# Patient Record
Sex: Male | Born: 1955 | Race: White | Hispanic: No | Marital: Married | Smoking: Never smoker
Health system: Southern US, Community
[De-identification: ages and names within clinical notes are randomized; demographics above are authoritative.]

## PROBLEM LIST (undated history)

## (undated) DIAGNOSIS — T7840XA Allergy, unspecified, initial encounter: Secondary | ICD-10-CM

## (undated) DIAGNOSIS — C801 Malignant (primary) neoplasm, unspecified: Secondary | ICD-10-CM

## (undated) DIAGNOSIS — M1990 Unspecified osteoarthritis, unspecified site: Secondary | ICD-10-CM

## (undated) DIAGNOSIS — I10 Essential (primary) hypertension: Secondary | ICD-10-CM

## (undated) DIAGNOSIS — G4730 Sleep apnea, unspecified: Secondary | ICD-10-CM

## (undated) DIAGNOSIS — E785 Hyperlipidemia, unspecified: Secondary | ICD-10-CM

## (undated) DIAGNOSIS — K219 Gastro-esophageal reflux disease without esophagitis: Secondary | ICD-10-CM

## (undated) HISTORY — PX: KNEE ARTHROSCOPY: SUR90

## (undated) HISTORY — DX: Hyperlipidemia, unspecified: E78.5

## (undated) HISTORY — DX: Malignant (primary) neoplasm, unspecified: C80.1

## (undated) HISTORY — PX: COLONOSCOPY: SHX174

## (undated) HISTORY — DX: Essential (primary) hypertension: I10

## (undated) HISTORY — DX: Gastro-esophageal reflux disease without esophagitis: K21.9

## (undated) HISTORY — DX: Sleep apnea, unspecified: G47.30

## (undated) HISTORY — DX: Allergy, unspecified, initial encounter: T78.40XA

## (undated) HISTORY — DX: Unspecified osteoarthritis, unspecified site: M19.90

---
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
## 1998-10-02 ENCOUNTER — Emergency Department (HOSPITAL_COMMUNITY): Admission: EM | Admit: 1998-10-02 | Discharge: 1998-10-02 | Payer: Self-pay | Admitting: Emergency Medicine

## 2004-01-03 ENCOUNTER — Ambulatory Visit: Payer: Self-pay | Admitting: Internal Medicine

## 2004-02-15 ENCOUNTER — Ambulatory Visit: Payer: Self-pay | Admitting: Internal Medicine

## 2004-02-18 ENCOUNTER — Ambulatory Visit: Payer: Self-pay | Admitting: Pulmonary Disease

## 2004-02-18 ENCOUNTER — Ambulatory Visit (HOSPITAL_BASED_OUTPATIENT_CLINIC_OR_DEPARTMENT_OTHER): Admission: RE | Admit: 2004-02-18 | Discharge: 2004-02-18 | Payer: Self-pay | Admitting: Internal Medicine

## 2005-09-11 ENCOUNTER — Ambulatory Visit: Payer: Self-pay | Admitting: Internal Medicine

## 2005-09-19 ENCOUNTER — Ambulatory Visit: Payer: Self-pay | Admitting: Internal Medicine

## 2005-11-07 ENCOUNTER — Ambulatory Visit: Payer: Self-pay | Admitting: Internal Medicine

## 2006-06-22 ENCOUNTER — Ambulatory Visit: Payer: Self-pay | Admitting: Internal Medicine

## 2006-07-01 ENCOUNTER — Ambulatory Visit: Payer: Self-pay

## 2006-08-06 ENCOUNTER — Ambulatory Visit: Payer: Self-pay | Admitting: Internal Medicine

## 2006-08-06 LAB — CONVERTED CEMR LAB
ALT: 24 units/L (ref 0–53)
AST: 24 units/L (ref 0–37)
Albumin: 3.4 g/dL — ABNORMAL LOW (ref 3.5–5.2)
Alkaline Phosphatase: 43 units/L (ref 39–117)
Cholesterol: 189 mg/dL (ref 0–200)
Total Bilirubin: 0.6 mg/dL (ref 0.3–1.2)
Total CHOL/HDL Ratio: 4.5

## 2006-08-13 ENCOUNTER — Ambulatory Visit: Payer: Self-pay | Admitting: Internal Medicine

## 2006-11-26 ENCOUNTER — Telehealth (INDEPENDENT_AMBULATORY_CARE_PROVIDER_SITE_OTHER): Payer: Self-pay | Admitting: *Deleted

## 2006-12-16 ENCOUNTER — Telehealth: Payer: Self-pay | Admitting: Internal Medicine

## 2007-02-23 ENCOUNTER — Ambulatory Visit: Payer: Self-pay | Admitting: Internal Medicine

## 2007-02-23 DIAGNOSIS — K219 Gastro-esophageal reflux disease without esophagitis: Secondary | ICD-10-CM | POA: Insufficient documentation

## 2007-02-23 DIAGNOSIS — I10 Essential (primary) hypertension: Secondary | ICD-10-CM | POA: Insufficient documentation

## 2007-02-23 DIAGNOSIS — J309 Allergic rhinitis, unspecified: Secondary | ICD-10-CM | POA: Insufficient documentation

## 2007-02-23 DIAGNOSIS — R635 Abnormal weight gain: Secondary | ICD-10-CM | POA: Insufficient documentation

## 2007-02-23 DIAGNOSIS — E785 Hyperlipidemia, unspecified: Secondary | ICD-10-CM | POA: Insufficient documentation

## 2007-04-09 ENCOUNTER — Telehealth: Payer: Self-pay | Admitting: Internal Medicine

## 2007-04-28 ENCOUNTER — Telehealth: Payer: Self-pay | Admitting: Internal Medicine

## 2007-05-25 ENCOUNTER — Telehealth: Payer: Self-pay | Admitting: Internal Medicine

## 2007-06-30 ENCOUNTER — Encounter: Payer: Self-pay | Admitting: Internal Medicine

## 2007-11-10 ENCOUNTER — Telehealth: Payer: Self-pay | Admitting: Internal Medicine

## 2008-03-13 ENCOUNTER — Encounter: Payer: Self-pay | Admitting: Internal Medicine

## 2008-08-10 ENCOUNTER — Telehealth (INDEPENDENT_AMBULATORY_CARE_PROVIDER_SITE_OTHER): Payer: Self-pay | Admitting: *Deleted

## 2008-08-18 ENCOUNTER — Ambulatory Visit: Payer: Self-pay | Admitting: Internal Medicine

## 2008-08-18 DIAGNOSIS — S93409A Sprain of unspecified ligament of unspecified ankle, initial encounter: Secondary | ICD-10-CM | POA: Insufficient documentation

## 2008-08-18 LAB — CONVERTED CEMR LAB
ALT: 23 units/L (ref 0–53)
AST: 22 units/L (ref 0–37)
Alkaline Phosphatase: 47 units/L (ref 39–117)
BUN: 12 mg/dL (ref 6–23)
Bilirubin Urine: NEGATIVE
Bilirubin, Direct: 0.1 mg/dL (ref 0.0–0.3)
Cholesterol: 170 mg/dL (ref 0–200)
Creatinine, Ser: 1.1 mg/dL (ref 0.4–1.5)
Eosinophils Relative: 3.4 % (ref 0.0–5.0)
GFR calc non Af Amer: 74.97 mL/min (ref >60)
LDL Cholesterol: 98 mg/dL (ref 0–99)
Monocytes Relative: 6.7 % (ref 3.0–12.0)
Neutrophils Relative %: 61 % (ref 43.0–77.0)
Nitrite: NEGATIVE
Platelets: 240 10*3/uL (ref 150.0–400.0)
Potassium: 4.2 meq/L (ref 3.5–5.1)
Protein, U semiquant: NEGATIVE
Total Bilirubin: 0.8 mg/dL (ref 0.3–1.2)
Total CHOL/HDL Ratio: 4
Triglycerides: 168 mg/dL — ABNORMAL HIGH (ref 0.0–149.0)
Urobilinogen, UA: 0.2
VLDL: 33.6 mg/dL (ref 0.0–40.0)
WBC: 7 10*3/uL (ref 4.5–10.5)

## 2008-09-12 ENCOUNTER — Ambulatory Visit: Payer: Self-pay | Admitting: Internal Medicine

## 2008-09-26 ENCOUNTER — Encounter: Payer: Self-pay | Admitting: Internal Medicine

## 2008-09-26 ENCOUNTER — Ambulatory Visit: Payer: Self-pay | Admitting: Internal Medicine

## 2008-10-02 ENCOUNTER — Encounter: Payer: Self-pay | Admitting: Internal Medicine

## 2009-02-23 ENCOUNTER — Ambulatory Visit: Payer: Self-pay | Admitting: Internal Medicine

## 2009-02-23 LAB — CONVERTED CEMR LAB
ALT: 35 units/L (ref 0–53)
HDL: 40.7 mg/dL (ref >39.00)
Total Bilirubin: 0.5 mg/dL (ref 0.3–1.2)
Total CHOL/HDL Ratio: 4
VLDL: 22.8 mg/dL (ref 0.0–40.0)

## 2009-02-24 ENCOUNTER — Encounter: Payer: Self-pay | Admitting: Internal Medicine

## 2009-03-02 ENCOUNTER — Ambulatory Visit: Payer: Self-pay | Admitting: Internal Medicine

## 2009-03-02 DIAGNOSIS — E669 Obesity, unspecified: Secondary | ICD-10-CM | POA: Insufficient documentation

## 2009-03-05 ENCOUNTER — Telehealth: Payer: Self-pay | Admitting: Internal Medicine

## 2009-04-02 ENCOUNTER — Telehealth: Payer: Self-pay | Admitting: Internal Medicine

## 2009-04-04 ENCOUNTER — Encounter: Payer: Self-pay | Admitting: Internal Medicine

## 2009-06-08 ENCOUNTER — Ambulatory Visit: Payer: Self-pay | Admitting: Internal Medicine

## 2009-08-10 ENCOUNTER — Ambulatory Visit: Payer: Self-pay | Admitting: Family Medicine

## 2009-08-10 DIAGNOSIS — J209 Acute bronchitis, unspecified: Secondary | ICD-10-CM | POA: Insufficient documentation

## 2009-12-21 ENCOUNTER — Ambulatory Visit: Payer: Self-pay | Admitting: Internal Medicine

## 2009-12-21 LAB — CONVERTED CEMR LAB
Free T4: 0.65 ng/dL — ABNORMAL LOW (ref 0.80–1.80)
T3, Free: 3.1 pg/mL (ref 2.3–4.2)
TSH: 2.079 microintl units/mL (ref 0.350–4.500)

## 2010-03-13 NOTE — Progress Notes (Signed)
Summary: new rx  Phone Note Call from Patient Call back at Home Phone [phone]   Caller: Spouse Call For: Stacie Glaze MD Summary of Call: pt needs new rx for [facility] call into [facility] [phone] Initial call taken by: Heron Sabins,  April 02, 2009 10:13 AM    New/Updated Medications: OMNARIS 50 MCG/ACT SUSP (CICLESONIDE) Use as directed Prescriptions: OMNARIS 50 MCG/ACT SUSP (CICLESONIDE) Use as directed  #1 x 6   Entered by:   Willy Eddy, LPN   Authorized by:   Stacie Glaze MD   Signed by:   Willy Eddy, LPN on 47/82/9562   Method used:   Electronically to        [facility] #[id number]* (retail)       [address]       [place], [place]  [address]       Ph: [phone]       Fax: [phone]   RxID:   [id number]

## 2010-03-13 NOTE — Assessment & Plan Note (Signed)
Summary: 3 month rov/njr   Vital Signs:  Patient profile:   55 year old male Height:      70 inches Weight:      258 pounds BMI:     37.15 Temp:     98.2 degrees F oral Pulse rate:   72 / minute Resp:     14 per minute BP sitting:   126 / 80  (left arm)  Vitals Entered By: Willy Eddy, LPN (June 08, 2009 4:20 PM) CC: roa   CC:  roa.  Preventive Screening-Counseling & Management  Alcohol-Tobacco     Smoking Status: never  Current Problems (verified): 1)  Morbid Obesity  (ICD-278.01) 2)  Special Screening For Malignant Neoplasms Colon  (ICD-V76.51) 3)  Preventive Health Care  (ICD-V70.0) 4)  Ankle Sprain, Right  (ICD-845.00) 5)  Abnormal Weight Gain  (ICD-783.1) 6)  Allergic Rhinitis  (ICD-477.9) 7)  Gerd  (ICD-530.81) 8)  Hypertension  (ICD-401.9) 9)  Hyperlipidemia  (ICD-272.4)  Current Medications (verified): 1)  Exforge 5-160 Mg  Tabs (Amlodipine Besylate-Valsartan) .... Once Daily 2)  Nasonex 50 Mcg/act  Susp (Mometasone Furoate) .... Two Sprays Q Hs in Each Nostril 3)  Zyrtec Allergy 10 Mg  Tabs (Cetirizine Hcl) .... One By Mouth Q Hs 4)  Lipitor 20 Mg  Tabs (Atorvastatin Calcium) .... One Tab By Mouth Daily 5)  Nexium 40 Mg  Cpdr (Esomeprazole Magnesium) .Marland Kitchen.. 1 Once Daily-No More Without Ov 6)  Zaleplon 10 Mg Caps (Zaleplon) .... One By Mouth Q Hs Prn 7)  Aspir-Low 81 Mg Tbec (Aspirin) .Marland Kitchen.. 1 Once Daily 8)  Xenical 120 Mg Caps (Orlistat) .... One By Mouth Three Times A Day Q Ac 9)  Clarinex 5 Mg Tabs (Desloratadine) .Marland Kitchen.. 1 Once Daily 10)  Flonase 50 Mcg/act Susp (Fluticasone Propionate) .Marland Kitchen.. 1 Spray Each Nostril Two Times A Day  Allergies (verified): No Known Drug Allergies  Past History:  Family History: Last updated: 02/23/2007 Father:  Mother: lymphoma stage 3 Siblings:  Family History Hypertension  Social History: Last updated: 02/23/2007 Married Alcohol use-yes Regular exercise-no  Risk Factors: Exercise: no (02/23/2007)  Risk  Factors: Smoking Status: never (06/08/2009)  Past medical, surgical, family and social histories (including risk factors) reviewed, and no changes noted (except as noted below).  Past Medical History: Reviewed history from 02/23/2007 and no changes required. Hyperlipidemia Hypertension GERD Allergic rhinitis  Family History: Reviewed history from 02/23/2007 and no changes required. Father:  Mother: lymphoma stage 3 Siblings:  Family History Hypertension  Social History: Reviewed history from 02/23/2007 and no changes required. Married Alcohol use-yes Regular exercise-no  Review of Systems  The patient denies anorexia, fever, weight loss, weight gain, vision loss, decreased hearing, hoarseness, chest pain, syncope, dyspnea on exertion, peripheral edema, prolonged cough, headaches, hemoptysis, abdominal pain, melena, hematochezia, severe indigestion/heartburn, hematuria, incontinence, genital sores, muscle weakness, suspicious skin lesions, transient blindness, difficulty walking, depression, unusual weight change, abnormal bleeding, enlarged lymph nodes, angioedema, and breast masses.    Physical Exam  General:  Well-developed,well-nourished,in no acute distress; alert,appropriate and cooperative throughout examination Head:  Normocephalic and atraumatic without obvious abnormalities. No apparent alopecia or balding. Ears:  R ear normal and L ear normal.   Nose:  no external deformity and no nasal discharge.   Neck:  No deformities, masses, or tenderness noted. Lungs:  normal respiratory effort and no dullness.   Heart:  normal rate and regular rhythm.   Abdomen:  soft, non-tender, and normal bowel sounds.   Msk:  normal ROM, no joint tenderness, and no joint swelling.     Impression & Recommendations:  Problem # 1:  MORBID OBESITY (ICD-278.01)  has lost 17 lbs in 3 months  Ht: 70 (06/08/2009)   Wt: 258 (06/08/2009)   BMI: 37.15 (06/08/2009)  Problem # 2:   HYPERTENSION (ICD-401.9) good control with the weight loss His updated medication list for this problem includes:    Exforge 5-160 Mg Tabs (Amlodipine besylate-valsartan) ..... Once daily  BP today: 126/80 Prior BP: 110/78 (03/02/2009)  Prior 10 Yr Risk Heart Disease: 11 % (08/18/2008)  Labs Reviewed: K+: 4.2 (08/18/2008) Creat: : 1.1 (08/18/2008)   Chol: 166 (02/23/2009)   HDL: 40.70 (02/23/2009)   LDL: 103 (02/23/2009)   TG: 114.0 (02/23/2009)  Problem # 3:  GERD (ICD-530.81)  His updated medication list for this problem includes:    Nexium 40 Mg Cpdr (Esomeprazole magnesium) .Marland Kitchen... 1 once daily-no more without ov  Labs Reviewed: Hgb: 14.2 (08/18/2008)   Hct: 40.7 (08/18/2008)  Problem # 4:  ALLERGIC RHINITIS (ICD-477.9)  His updated medication list for this problem includes:    Nasonex 50 Mcg/act Susp (Mometasone furoate) .Marland Kitchen..Marland Kitchen Two sprays q hs in each nostril    Zyrtec Allergy 10 Mg Tabs (Cetirizine hcl) ..... One by mouth q hs    Clarinex 5 Mg Tabs (Desloratadine) .Marland Kitchen... 1 once daily    Flonase 50 Mcg/act Susp (Fluticasone propionate) .Marland Kitchen... 1 spray each nostril two times a day  Discussed use of allergy medications and environmental measures.   Complete Medication List: 1)  Exforge 5-160 Mg Tabs (Amlodipine besylate-valsartan) .... Once daily 2)  Nasonex 50 Mcg/act Susp (Mometasone furoate) .... Two sprays q hs in each nostril 3)  Zyrtec Allergy 10 Mg Tabs (Cetirizine hcl) .... One by mouth q hs 4)  Lipitor 20 Mg Tabs (Atorvastatin calcium) .... One tab by mouth daily 5)  Nexium 40 Mg Cpdr (Esomeprazole magnesium) .Marland Kitchen.. 1 once daily-no more without ov 6)  Zaleplon 10 Mg Caps (Zaleplon) .... One by mouth q hs prn 7)  Aspir-low 81 Mg Tbec (Aspirin) .Marland Kitchen.. 1 once daily 8)  Xenical 120 Mg Caps (Orlistat) .... One by mouth three times a day q ac 9)  Clarinex 5 Mg Tabs (Desloratadine) .Marland Kitchen.. 1 once daily 10)  Flonase 50 Mcg/act Susp (Fluticasone propionate) .Marland Kitchen.. 1 spray each nostril  two times a day 11)  Phentermine Hcl 37.5 Mg Tabs (Phentermine hcl) .... One by mouth daily  Patient Instructions: 1)  Please schedule a follow-up appointment in 3 months. 2)  The diet    DASH diet   [email or web address] Prescriptions: PHENTERMINE HCL 37.5 MG TABS (PHENTERMINE HCL) one by mouth daily  #30 x 2   Entered and Authorized by:   Stacie Glaze MD   Signed by:   Stacie Glaze MD on 06/08/2009   Method used:   Print then Give to Patient   RxID:   [id number]

## 2010-03-13 NOTE — Assessment & Plan Note (Signed)
Summary: 3 MNTH ROV//SLM   Vital Signs:  Patient profile:   55 year old male Height:      70 inches Weight:      264 pounds BMI:     38.02 Temp:     98.2 degrees F oral Pulse rate:   72 / minute Resp:     14 per minute BP sitting:   120 / 76  (left arm) Cuff size:   large  Vitals Entered By: Willy Eddy, LPN (December 21, 2009 4:16 PM) CC: roa   Primary Care Provider:  Stacie Glaze MD  CC:  roa.  History of Present Illness: weight loss... has not lost weight and is in denial about diet   states that he has been following diet but hs gaiened weight has very high stress job and has to eat in restraunt ankle [place] with persistant swelling  Problems Prior to Update: 1)  Acute Bronchitis  (ICD-466.0) 2)  Morbid Obesity  (ICD-278.01) 3)  Special Screening For Malignant Neoplasms Colon  (ICD-V76.51) 4)  Preventive Health Care  (ICD-V70.0) 5)  Ankle Sprain, Right  (ICD-845.00) 6)  Abnormal Weight Gain  (ICD-783.1) 7)  Allergic Rhinitis  (ICD-477.9) 8)  Gerd  (ICD-530.81) 9)  Hypertension  (ICD-401.9) 10)  Hyperlipidemia  (ICD-272.4)  Medications Prior to Update: 1)  Exforge 5-160 Mg  Tabs (Amlodipine Besylate-Valsartan) .... Once Daily 2)  Nasonex 50 Mcg/act  Susp (Mometasone Furoate) .... Two Sprays Q Hs in Each Nostril 3)  Zyrtec Allergy 10 Mg  Tabs (Cetirizine Hcl) .... One By Mouth Q Hs 4)  Lipitor 20 Mg  Tabs (Atorvastatin Calcium) .... One Tab By Mouth Daily 5)  Nexium 40 Mg  Cpdr (Esomeprazole Magnesium) .Marland Kitchen.. 1 Once Daily 6)  Zaleplon 10 Mg Caps (Zaleplon) .... One By Mouth Q Hs Prn 7)  Aspir-Low 81 Mg Tbec (Aspirin) .Marland Kitchen.. 1 Once Daily 8)  Xenical 120 Mg Caps (Orlistat) .... One By Mouth Three Times A Day Q Ac 9)  Clarinex 5 Mg Tabs (Desloratadine) .Marland Kitchen.. 1 Once Daily 10)  Flonase 50 Mcg/act Susp (Fluticasone Propionate) .Marland Kitchen.. 1 Spray Each Nostril Two Times A Day 11)  Phentermine Hcl 37.5 Mg Tabs (Phentermine Hcl) .... One By Mouth Daily 12)  Hydromet 5-1.5  Mg/74ml Syrp (Hydrocodone-Homatropine) .Marland Kitchen.. 1 Tsp Q 4 Hours As Needed Cough  Current Medications (verified): 1)  Exforge 5-160 Mg  Tabs (Amlodipine Besylate-Valsartan) .... Once Daily 2)  Nasonex 50 Mcg/act  Susp (Mometasone Furoate) .... Two Sprays Q Hs in Each Nostril 3)  Zyrtec Allergy 10 Mg  Tabs (Cetirizine Hcl) .... One By Mouth Q Hs 4)  Lipitor 20 Mg  Tabs (Atorvastatin Calcium) .... One Tab By Mouth Daily 5)  Nexium 40 Mg  Cpdr (Esomeprazole Magnesium) .Marland Kitchen.. 1 Once Daily 6)  Aspir-Low 81 Mg Tbec (Aspirin) .Marland Kitchen.. 1 Once Daily 7)  Clarinex 5 Mg Tabs (Desloratadine) .Marland Kitchen.. 1 Once Daily 8)  Nasal 0.65 % Soln (Saline) .... Lavage  Nose At Least Twice A Day 9)  Clarithromycin 500 Mg Xr24h-Tab (Clarithromycin) .... One By Mouth Two Times A Day  Allergies (verified): No Known Drug Allergies  Past History:  Family History: Last updated: 02/23/2007 Father:  Mother: lymphoma stage 3 Siblings:  Family History Hypertension  Social History: Last updated: 02/23/2007 Married Alcohol use-yes Regular exercise-no  Risk Factors: Exercise: no (02/23/2007)  Risk Factors: Smoking Status: never (06/08/2009)  Past medical, surgical, family and social histories (including risk factors) reviewed, and no changes noted (except as  noted below).  Past Medical History: Reviewed history from 02/23/2007 and no changes required. Hyperlipidemia Hypertension GERD Allergic rhinitis  Family History: Reviewed history from 02/23/2007 and no changes required. Father:  Mother: lymphoma stage 3 Siblings:  Family History Hypertension  Social History: Reviewed history from 02/23/2007 and no changes required. Married Alcohol use-yes Regular exercise-no  Review of Systems  The patient denies anorexia, fever, weight loss, weight gain, vision loss, decreased hearing, hoarseness, chest pain, syncope, dyspnea on exertion, peripheral edema, prolonged cough, headaches, hemoptysis, abdominal pain, melena,  hematochezia, severe indigestion/heartburn, hematuria, incontinence, genital sores, muscle weakness, suspicious skin lesions, transient blindness, difficulty walking, depression, unusual weight change, abnormal bleeding, enlarged lymph nodes, angioedema, and breast masses.    Physical Exam  General:  Well-developed,well-nourished,in no acute distress; alert,appropriate and cooperative throughout examination Head:  Normocephalic and atraumatic without obvious abnormalities. No apparent alopecia or balding. Eyes:  No corneal or conjunctival inflammation noted. EOMI. Perrla. Funduscopic exam benign, without hemorrhages, exudates or papilledema. Vision grossly normal. Nose:  External nasal examination shows no deformity or inflammation. Nasal mucosa are pink and moist without lesions or exudates. Neck:  No deformities, masses, or tenderness noted. Lungs:  scattered rhonchi Heart:  Normal rate and regular rhythm. S1 and S2 normal without gallop, murmur, click, rub or other extra sounds. Abdomen:  soft, non-tender, and normal bowel sounds.     Impression & Recommendations:  Problem # 1:  MORBID OBESITY (ICD-278.01)  Orders: TLB-A1C / Hgb A1C (Glycohemoglobin) (83036-A1C)  Problem # 2:  HYPERTENSION (ICD-401.9)  His updated medication list for this problem includes:    Exforge 5-160 Mg Tabs (Amlodipine besylate-valsartan) ..... Once daily  Problem # 3:  HYPERLIPIDEMIA (ICD-272.4)  His updated medication list for this problem includes:    Lipitor 20 Mg Tabs (Atorvastatin calcium) ..... One tab by mouth daily  Orders: TLB-TSH (Thyroid Stimulating Hormone) (84443-TSH) TLB-T3, Free (Triiodothyronine) (84481-T3FREE) TLB-T4 (Thyrox), Free [phone])  Complete Medication List: 1)  Exforge 5-160 Mg Tabs (Amlodipine besylate-valsartan) .... Once daily 2)  Nasonex 50 Mcg/act Susp (Mometasone furoate) .... Two sprays q hs in each nostril 3)  Zyrtec Allergy 10 Mg Tabs (Cetirizine hcl) .... One  by mouth q hs 4)  Lipitor 20 Mg Tabs (Atorvastatin calcium) .... One tab by mouth daily 5)  Nexium 40 Mg Cpdr (Esomeprazole magnesium) .Marland Kitchen.. 1 once daily 6)  Aspir-low 81 Mg Tbec (Aspirin) .Marland Kitchen.. 1 once daily 7)  Clarinex 5 Mg Tabs (Desloratadine) .Marland Kitchen.. 1 once daily 8)  Nasal 0.65 % Soln (Saline) .... Lavage  nose at least twice a day 9)  Clarithromycin 500 Mg Xr24h-tab (Clarithromycin) .... One by mouth two times a day  Patient Instructions: 1)  Please schedule a follow-up appointment in 3 months. Prescriptions: NASONEX 50 MCG/ACT  SUSP (MOMETASONE FUROATE) two sprays q HS in each nostril  #1 x 11   Entered and Authorized by:   Stacie Glaze MD   Signed by:   Stacie Glaze MD on 12/21/2009   Method used:   Electronically to        [facility] #[id number]* (retail)       [address]       [place], [place]  [address]       Ph: [phone]       Fax: [phone]   RxID:   [id number] CLARITHROMYCIN 500 MG XR24H-TAB (CLARITHROMYCIN) one by mouth two times a day  #20 x 0  Entered and Authorized by:   Stacie Glaze MD   Signed by:   Stacie Glaze MD on 12/21/2009   Method used:   Electronically to        [facility] [phone]* (retail)       [address]       [place], [place]  [address]       Ph: [phone]       Fax: [phone]   RxID:   [id number]    Orders Added: 1)  Est. Patient Level IV [75643] 2)  TLB-TSH (Thyroid Stimulating Hormone) [84443-TSH] 3)  TLB-T3, Free (Triiodothyronine) [84481-T3FREE] 4)  TLB-T4 (Thyrox), Free [32951-OA4Z] 5)  TLB-A1C / Hgb A1C (Glycohemoglobin) [83036-A1C]   Immunization History:  Influenza Immunization History:    Influenza:  historical (12/11/2009)   Immunization History:  Influenza Immunization History:    Influenza:  Historical (12/11/2009)

## 2010-03-13 NOTE — Progress Notes (Signed)
Summary: Clarinex  Phone Note Call from Patient   Caller: Patient Call For: Stacie Glaze MD Summary of Call: Dyann Kief greens [facility]) Clarinex 5mg . one by mouth two times a day samples given to pt by Dr. Lovell Sheehan and he wants RX called in. [phone] Initial call taken by: Lynann Beaver CMA,  March 05, 2009 4:22 PM    New/Updated Medications: CLARINEX 5 MG TABS (DESLORATADINE) 1 once daily Prescriptions: CLARINEX 5 MG TABS (DESLORATADINE) 1 once daily  #30 x 11   Entered by:   Willy Eddy, LPN   Authorized by:   Stacie Glaze MD   Signed by:   Willy Eddy, LPN on 45/40/9811   Method used:   Electronically to        [facility] #[id number]* (retail)       [address]       [place], [place]  [address]       Ph: [phone]       Fax: [phone]   RxID:   [id number]

## 2010-03-13 NOTE — Medication Information (Signed)
Summary: Prior Authorization for [facility]  Prior Authorization for [employer]   Imported By: Maryln Gottron 04/13/2009 09:45:02  _____________________________________________________________________  External Attachment:    Type:   Image     Comment:   External Document

## 2010-03-13 NOTE — Assessment & Plan Note (Signed)
Summary: BODY ACHES, CONGESTION, ST // RS   Vital Signs:  Patient profile:   55 year old male Weight:      259 pounds Temp:     98.2 degrees F oral BP sitting:   120 / 76  (left arm) Cuff size:   large  Vitals Entered By: Raechel Ache, RN (August 10, 2009 1:58 PM) CC: C/o sore throat, achy joints, sore chest and cough x few days.   History of Present Illness: Here for 5 days of chest congestion, ST, and coughing up green sputum. No fever.   Allergies (verified): No Known Drug Allergies  Past History:  Past Medical History: Reviewed history from 02/23/2007 and no changes required. Hyperlipidemia Hypertension GERD Allergic rhinitis  Review of Systems  The patient denies anorexia, fever, weight loss, weight gain, vision loss, decreased hearing, hoarseness, chest pain, syncope, dyspnea on exertion, peripheral edema, headaches, hemoptysis, abdominal pain, melena, hematochezia, severe indigestion/heartburn, hematuria, incontinence, genital sores, muscle weakness, suspicious skin lesions, transient blindness, difficulty walking, depression, unusual weight change, abnormal bleeding, enlarged lymph nodes, angioedema, breast masses, and testicular masses.    Physical Exam  General:  Well-developed,well-nourished,in no acute distress; alert,appropriate and cooperative throughout examination Head:  Normocephalic and atraumatic without obvious abnormalities. No apparent alopecia or balding. Eyes:  No corneal or conjunctival inflammation noted. EOMI. Perrla. Funduscopic exam benign, without hemorrhages, exudates or papilledema. Vision grossly normal. Ears:  External ear exam shows no significant lesions or deformities.  Otoscopic examination reveals clear canals, tympanic membranes are intact bilaterally without bulging, retraction, inflammation or discharge. Hearing is grossly normal bilaterally. Nose:  External nasal examination shows no deformity or inflammation. Nasal mucosa are pink  and moist without lesions or exudates. Mouth:  Oral mucosa and oropharynx without lesions or exudates.  Teeth in good repair. Neck:  No deformities, masses, or tenderness noted. Lungs:  scattered rhonchi Heart:  Normal rate and regular rhythm. S1 and S2 normal without gallop, murmur, click, rub or other extra sounds.   Impression & Recommendations:  Problem # 1:  ACUTE BRONCHITIS (ICD-466.0)  His updated medication list for this problem includes:    Zithromax Z-pak 250 Mg Tabs (Azithromycin) .Marland Kitchen... As directed    Hydromet 5-1.5 Mg/69ml Syrp (Hydrocodone-homatropine) .Marland Kitchen... 1 tsp q 4 hours as needed cough  Complete Medication List: 1)  Exforge 5-160 Mg Tabs (Amlodipine besylate-valsartan) .... Once daily 2)  Nasonex 50 Mcg/act Susp (Mometasone furoate) .... Two sprays q hs in each nostril 3)  Zyrtec Allergy 10 Mg Tabs (Cetirizine hcl) .... One by mouth q hs 4)  Lipitor 20 Mg Tabs (Atorvastatin calcium) .... One tab by mouth daily 5)  Nexium 40 Mg Cpdr (Esomeprazole magnesium) .Marland Kitchen.. 1 once daily-no more without ov 6)  Zaleplon 10 Mg Caps (Zaleplon) .... One by mouth q hs prn 7)  Aspir-low 81 Mg Tbec (Aspirin) .Marland Kitchen.. 1 once daily 8)  Xenical 120 Mg Caps (Orlistat) .... One by mouth three times a day q ac 9)  Clarinex 5 Mg Tabs (Desloratadine) .Marland Kitchen.. 1 once daily 10)  Flonase 50 Mcg/act Susp (Fluticasone propionate) .Marland Kitchen.. 1 spray each nostril two times a day 11)  Phentermine Hcl 37.5 Mg Tabs (Phentermine hcl) .... One by mouth daily 12)  Zithromax Z-pak 250 Mg Tabs (Azithromycin) .... As directed 13)  Hydromet 5-1.5 Mg/37ml Syrp (Hydrocodone-homatropine) .Marland Kitchen.. 1 tsp q 4 hours as needed cough  Patient Instructions: 1)  Please schedule a follow-up appointment as needed .  Prescriptions: FLONASE 50 MCG/ACT SUSP (FLUTICASONE  PROPIONATE) 1 spray each nostril two times a day  #30 x 5   Entered and Authorized by:   Nelwyn Salisbury MD   Signed by:   Nelwyn Salisbury MD on 08/10/2009   Method used:   Print  then Give to Patient   RxID:   [id number] HYDROMET 5-1.5 MG/5ML SYRP (HYDROCODONE-HOMATROPINE) 1 tsp q 4 hours as needed cough  #240 x 0   Entered and Authorized by:   Nelwyn Salisbury MD   Signed by:   Nelwyn Salisbury MD on 08/10/2009   Method used:   Print then Give to Patient   RxID:   [id number] Z-PAK 250 MG TABS (AZITHROMYCIN) as directed  #1 x 0   Entered and Authorized by:   Nelwyn Salisbury MD   Signed by:   Nelwyn Salisbury MD on 08/10/2009   Method used:   Print then Give to Patient   RxID:   [id number]

## 2010-03-13 NOTE — Assessment & Plan Note (Signed)
Summary: ROV/NJR rsc bmp/njr   Vital Signs:  Patient profile:   55 year old male Height:      70 inches Weight:      274 pounds BMI:     39.46 Temp:     98.2 degrees F oral Pulse rate:   72 / minute Resp:     14 per minute BP sitting:   110 / 78  (left arm)  Vitals Entered By: Willy Eddy, LPN (March 02, 2009 4:11 PM) CC: roa labs   CC:  roa labs.  History of Present Illness:  Hyperlipidemia Follow-Up      This is a 55 year old man who presents for Hyperlipidemia follow-up.  The patient denies muscle aches, GI upset, abdominal pain, flushing, itching, constipation, diarrhea, and fatigue.  The patient denies the following symptoms: chest pain/pressure, exercise intolerance, dypsnea, palpitations, syncope, and pedal edema.  Compliance with medications (by patient report) has been near 100%.  Dietary compliance has been excellent.  The patient reports not exercizing due to ankle pain.  Ankle pain ( bednars) mri stress fracture with persistant pain  Preventive Screening-Counseling & Management  Alcohol-Tobacco     Smoking Status: never  Problems Prior to Update: 1)  Special Screening For Malignant Neoplasms Colon  (ICD-V76.51) 2)  Preventive Health Care  (ICD-V70.0) 3)  Ankle Sprain, Right  (ICD-845.00) 4)  Abnormal Weight Gain  (ICD-783.1) 5)  Allergic Rhinitis  (ICD-477.9) 6)  Gerd  (ICD-530.81) 7)  Hypertension  (ICD-401.9) 8)  Hyperlipidemia  (ICD-272.4)  Medications Prior to Update: 1)  Lipitor 10 Mg  Tabs (Atorvastatin Calcium) .... Once Daily 2)  Exforge 5-160 Mg  Tabs (Amlodipine Besylate-Valsartan) .... Once Daily 3)  Nasonex 50 Mcg/act  Susp (Mometasone Furoate) .... Two Sprays Q Hs in Each Nostril 4)  Zyrtec Allergy 10 Mg  Tabs (Cetirizine Hcl) .... One By Mouth Q Hs 5)  Lipitor 20 Mg  Tabs (Atorvastatin Calcium) .... One Tab By Mouth Daily 6)  Nexium 40 Mg  Cpdr (Esomeprazole Magnesium) .Marland Kitchen.. 1 Once Daily-No More Without Ov 7)  Zaleplon 10 Mg  Caps (Zaleplon) .... One By Mouth Q Hs Prn  Current Medications (verified): 1)  Exforge 5-160 Mg  Tabs (Amlodipine Besylate-Valsartan) .... Once Daily 2)  Nasonex 50 Mcg/act  Susp (Mometasone Furoate) .... Two Sprays Q Hs in Each Nostril 3)  Zyrtec Allergy 10 Mg  Tabs (Cetirizine Hcl) .... One By Mouth Q Hs 4)  Lipitor 20 Mg  Tabs (Atorvastatin Calcium) .... One Tab By Mouth Daily 5)  Nexium 40 Mg  Cpdr (Esomeprazole Magnesium) .Marland Kitchen.. 1 Once Daily-No More Without Ov 6)  Zaleplon 10 Mg Caps (Zaleplon) .... One By Mouth Q Hs Prn 7)  Aspir-Low 81 Mg Tbec (Aspirin) .Marland Kitchen.. 1 Once Daily 8)  Xenical 120 Mg Caps (Orlistat) .... One By Mouth Three Times A Day Q Ac  Allergies (verified): No Known Drug Allergies  Past History:  Family History: Last updated: 02/23/2007 Father:  Mother: lymphoma stage 3 Siblings:  Family History Hypertension  Social History: Last updated: 02/23/2007 Married Alcohol use-yes Regular exercise-no  Risk Factors: Exercise: no (02/23/2007)  Risk Factors: Smoking Status: never (03/02/2009)  Past medical, surgical, family and social histories (including risk factors) reviewed, and no changes noted (except as noted below).  Past Medical History: Reviewed history from 02/23/2007 and no changes required. Hyperlipidemia Hypertension GERD Allergic rhinitis  Family History: Reviewed history from 02/23/2007 and no changes required. Father:  Mother: lymphoma stage 3 Siblings:  Family History Hypertension  Social History: Reviewed history from 02/23/2007 and no changes required. Married Alcohol use-yes Regular exercise-no Smoking Status:  never  Review of Systems  The patient denies anorexia, fever, weight loss, weight gain, vision loss, decreased hearing, hoarseness, chest pain, syncope, dyspnea on exertion, peripheral edema, prolonged cough, headaches, hemoptysis, abdominal pain, melena, hematochezia, severe indigestion/heartburn, hematuria, incontinence,  genital sores, muscle weakness, suspicious skin lesions, transient blindness, difficulty walking, depression, unusual weight change, abnormal bleeding, enlarged lymph nodes, angioedema, breast masses, and testicular masses.    Physical Exam  General:  Well-developed,well-nourished,in no acute distress; alert,appropriate and cooperative throughout examination Head:  Normocephalic and atraumatic without obvious abnormalities. No apparent alopecia or balding. Nose:  nose piercing noted and no external erythema.   Mouth:  good dentition and pharynx pink and moist.   Neck:  No deformities, masses, or tenderness noted. Lungs:  normal respiratory effort and no wheezes.   Heart:  normal rate and regular rhythm.   Abdomen:  Bowel sounds positive,abdomen soft and non-tender without masses, organomegaly or hernias noted. Msk:  joint tenderness.   Extremities:  trace left pedal edema and trace right pedal edema.   Neurologic:  alert & oriented X3 and cranial nerves II-XII intact.     Impression & Recommendations:  Problem # 1:  HYPERLIPIDEMIA (ICD-272.4)  The following medications were removed from the medication list:    Lipitor 10 Mg Tabs (Atorvastatin calcium) ..... Once daily His updated medication list for this problem includes:    Lipitor 20 Mg Tabs (Atorvastatin calcium) ..... One tab by mouth daily  Labs Reviewed: SGOT: 27 (02/23/2009)   SGPT: 35 (02/23/2009)  Prior 10 Yr Risk Heart Disease: 11 % (08/18/2008)   HDL:40.70 (02/23/2009), 38.20 (08/18/2008)  LDL:103 (02/23/2009), 98 (16/11/9602)  Chol:166 (02/23/2009), 170 (08/18/2008)  Trig:114.0 (02/23/2009), 168.0 (08/18/2008)  Problem # 2:  MORBID OBESITY (ICD-278.01) xenical dash diet counselling I have spent greater that 30 min face to face evaluating this patient   Ht: 70 (03/02/2009)   Wt: 274 (03/02/2009)   BMI: 39.46 (03/02/2009)  Problem # 3:  ANKLE SPRAIN, RIGHT (ICD-845.00)  His updated medication list for this problem  includes:    Aspir-low 81 Mg Tbec (Aspirin) .Marland Kitchen... 1 once daily  Instructed to use a compression wrap, elevate the affected area, apply ICE for 20 minutes every hour while awake for next 3 days, and rest. Start physical therapy as directed and recheck in 10-14 days if no improvement, sooner if worse.  Complete Medication List: 1)  Exforge 5-160 Mg Tabs (Amlodipine besylate-valsartan) .... Once daily 2)  Nasonex 50 Mcg/act Susp (Mometasone furoate) .... Two sprays q hs in each nostril 3)  Zyrtec Allergy 10 Mg Tabs (Cetirizine hcl) .... One by mouth q hs 4)  Lipitor 20 Mg Tabs (Atorvastatin calcium) .... One tab by mouth daily 5)  Nexium 40 Mg Cpdr (Esomeprazole magnesium) .Marland Kitchen.. 1 once daily-no more without ov 6)  Zaleplon 10 Mg Caps (Zaleplon) .... One by mouth q hs prn 7)  Aspir-low 81 Mg Tbec (Aspirin) .Marland Kitchen.. 1 once daily 8)  Xenical 120 Mg Caps (Orlistat) .... One by mouth three times a day q ac  Patient Instructions: 1)  xenical with a lower fat diet will eliminate 1/4 of cal 2)  dash diet 3)  www.[email or web address].org 4)  alternative 5)  weigth watcher fo men equal to this but the point system may not work in stress 6)  Please schedule a follow-up appointment in 3 months. Prescriptions: XENICAL 120  MG CAPS (ORLISTAT) one by mouth three times a day q AC  #90 x 3   Entered and Authorized by:   Stacie Glaze MD   Signed by:   Stacie Glaze MD on 03/02/2009   Method used:   Electronically to        [facility] [id number] (retail)       [address]       [place], [place]  [address]       Ph: [phone]       Fax: [phone]   RxID:   [id number]

## 2010-03-13 NOTE — Letter (Signed)
Summary: [facility]   Imported By: Maryln Gottron 03/08/2009 11:25:44  _____________________________________________________________________  External Attachment:    Type:   Image     Comment:   External Document

## 2010-04-04 ENCOUNTER — Encounter: Payer: Self-pay | Admitting: Internal Medicine

## 2010-04-05 ENCOUNTER — Encounter: Payer: Self-pay | Admitting: Internal Medicine

## 2010-04-05 ENCOUNTER — Ambulatory Visit (INDEPENDENT_AMBULATORY_CARE_PROVIDER_SITE_OTHER): Payer: BC Managed Care – PPO | Admitting: Internal Medicine

## 2010-04-05 DIAGNOSIS — E785 Hyperlipidemia, unspecified: Secondary | ICD-10-CM

## 2010-04-05 DIAGNOSIS — I10 Essential (primary) hypertension: Secondary | ICD-10-CM

## 2010-04-05 DIAGNOSIS — R635 Abnormal weight gain: Secondary | ICD-10-CM

## 2010-04-05 NOTE — Progress Notes (Signed)
Subjective:    Patient ID: Billy Todd, male    DOB: 05-02-1955, 55 y.o.   MRN: [id number]  HPI Billy Todd is a 55 year old white male who presents for followup of hyperlipidemia hypertension and morbid obesity he is been unable to maintain his diet and he has not lost weight his weight still up still 275 pounds he still is qualified as morbidly obese.  His blood pressure however is on well-controlled on the current regimen and he has no chest pain shortness of breath or any other cardiovascular symptoms he does exercise intermittently and has not experienced any cardiovascular symptoms.   he is compliant with his medications just not compliant with his diet he also has elevated cholesterol and part of the treatment plan was to lose weight and to follow a more rigid diet.  He states he has a lot of stress at work and is too busy to exercise and to busy to follow a diet he admits that he eats late at night and this is also a problem with his weight gain    Review of Systems  Constitutional: Positive for unexpected weight change. Negative for fever and fatigue.  HENT: Negative for hearing loss, congestion, neck pain and postnasal drip.   Eyes: Negative for discharge, redness and visual disturbance.  Respiratory: Negative for cough, shortness of breath and wheezing.   Cardiovascular: Negative for leg swelling.  Gastrointestinal: Negative for abdominal pain, constipation and abdominal distention.  Genitourinary: Negative for urgency and frequency.  Musculoskeletal: Negative for joint swelling and arthralgias.  Skin: Negative for color change and rash.  Neurological: Negative for weakness and light-headedness.  Hematological: Negative for adenopathy.  Psychiatric/Behavioral: Negative for behavioral problems.   History reviewed. No pertinent past medical history. History reviewed. No pertinent past surgical history.  reports that he has never smoked. He does not have any smokeless  tobacco history on file. He reports that he does not drink alcohol. His drug history not on file. family history includes Cancer in his maternal uncle and mother; Heart disease in his brother, father, and paternal uncle; Lymphoma in his mother; and Stroke in his brother.        Objective:   Physical Exam  Constitutional: He is oriented to person, place, and time. He appears well-developed and well-nourished.        Morbidly obese white male in no apparent distress  HENT:  Head: Normocephalic and atraumatic.  Eyes: Conjunctivae are normal. Pupils are equal, round, and reactive to light.  Neck: Normal range of motion. Neck supple.  Cardiovascular: Normal rate and regular rhythm.   Pulmonary/Chest: Effort normal and breath sounds normal.  Abdominal: Soft. Bowel sounds are normal.  Musculoskeletal: Normal range of motion.  Neurological: He is alert and oriented to person, place, and time.  Skin: Skin is warm and dry.  Psychiatric: Judgment and thought content normal.          Assessment & Plan:   his blood pressure is well controlled with current medications he is compliant with medications however I'm concerned about his persistent obesity and the ramifications of this disease state certainly diabetes degenerative joint disease chronic reflux are all a high risk diagnoses complications for this patient.   we have again reviewed strategies for weight loss including portion reduction low-calorie diet exercise consideration of referral to a dietitian he often wants the quick way out and has talked about another "" pill"" until he makes time for major diet and exercise change change he is  not going to find effective weight loss. We have discussed referral to [facility] for the [facility] diet and this may be the best option for him he'll followup in 3 months for monitoring of his blood pressure his hyperlipidemia and his obesity

## 2010-04-06 ENCOUNTER — Other Ambulatory Visit: Payer: Self-pay | Admitting: Internal Medicine

## 2010-04-21 ENCOUNTER — Other Ambulatory Visit: Payer: Self-pay | Admitting: Internal Medicine

## 2010-05-20 ENCOUNTER — Other Ambulatory Visit: Payer: Self-pay | Admitting: Internal Medicine

## 2010-06-04 ENCOUNTER — Other Ambulatory Visit: Payer: Self-pay | Admitting: Internal Medicine

## 2010-06-28 NOTE — Procedures (Signed)
Billy Todd, Billy Todd              ACCOUNT NO.:  [id number]   MEDICAL RECORD NO.:  [id number]          PATIENT TYPE:  OUT   LOCATION:  SLEEP CENTER                 FACILITY:  [facility]   PHYSICIAN:  Marcelyn Bruins, M.D. [facility] DATE OF BIRTH:  13-Dec-1955   DATE OF STUDY:  02/18/2004                              NOCTURNAL POLYSOMNOGRAM   REFERRING PHYSICIAN:  Dr. Darryll Capers   INDICATIONS FOR THE STUDY:  Hypersomnia with sleep apnea.   SLEEP ARCHITECTURE:  The patient had total sleep time of 384 minutes with  decreased REM and slow wave sleep. Sleep efficiency was at 80%. Sleep onset  latency was prolonged at 50 minutes, and REM latency was prolonged as well.   IMPRESSION:  1.  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 This gave the patient a respiratory disturbance index of 57      events per hour with O2 desaturation as low as 81%. Events primarily      occurred in the supine position and were associated with severe snoring.      By protocol, the patient was placed on a C-PAP mask consisting of a      medium ResMed full face mask. C-PAP was then titrated upwards and at a      level of 16 cm of pressure. The patient had excellent control of the      obstructive events even through supine REM.  2.  No clinically significant cardiac arrhythmias.  3.  Small numbers of leg jerks with minimal sleep disruption.      KC/MEDQ  D:  02/28/2004 11:56:50  T:  02/28/2004 13:09:06  Job:  [id number]

## 2010-07-05 ENCOUNTER — Ambulatory Visit: Payer: BC Managed Care – PPO | Admitting: Internal Medicine

## 2010-07-07 ENCOUNTER — Other Ambulatory Visit: Payer: Self-pay | Admitting: Internal Medicine

## 2010-07-13 ENCOUNTER — Other Ambulatory Visit: Payer: Self-pay | Admitting: Internal Medicine

## 2010-08-05 ENCOUNTER — Other Ambulatory Visit: Payer: Self-pay | Admitting: Internal Medicine

## 2010-08-12 ENCOUNTER — Other Ambulatory Visit: Payer: Self-pay | Admitting: Internal Medicine

## 2010-09-04 ENCOUNTER — Other Ambulatory Visit: Payer: Self-pay | Admitting: Internal Medicine

## 2010-10-06 ENCOUNTER — Other Ambulatory Visit: Payer: Self-pay | Admitting: Internal Medicine

## 2010-10-07 ENCOUNTER — Other Ambulatory Visit: Payer: Self-pay | Admitting: *Deleted

## 2010-10-07 MED ORDER — ATORVASTATIN CALCIUM 20 MG PO TABS: 20.0000 mg | ORAL_TABLET | Freq: Every day | ORAL | Status: DC

## 2010-10-07 MED ORDER — ESOMEPRAZOLE MAGNESIUM 40 MG PO CPDR: 40.0000 mg | DELAYED_RELEASE_CAPSULE | Freq: Every day | ORAL | Status: DC

## 2010-10-20 ENCOUNTER — Other Ambulatory Visit: Payer: Self-pay | Admitting: Internal Medicine

## 2010-11-03 ENCOUNTER — Other Ambulatory Visit: Payer: Self-pay | Admitting: Internal Medicine

## 2010-11-04 ENCOUNTER — Telehealth: Payer: Self-pay | Admitting: *Deleted

## 2010-11-04 MED ORDER — AMLODIPINE BESYLATE-VALSARTAN 5-160 MG PO TABS: 1.0000 | ORAL_TABLET | Freq: Every day | ORAL | Status: DC

## 2010-11-04 NOTE — Telephone Encounter (Signed)
Wife is calling to have a prescription faxed to [facility] for a new CPap machine with all new equipment.  Has been 5 years since replaced.  Any questions:  Call  Wife Clydie Braun) at [phone].

## 2010-11-04 NOTE — Telephone Encounter (Signed)
They give him a border for a new CPAP but we need to find out what pressure his CPAP is set at before calling in the order

## 2010-11-04 NOTE — Telephone Encounter (Signed)
Talked with wife and she will call [facility] to find out setting adn let us know and we will send in new order

## 2010-11-04 NOTE — Telephone Encounter (Signed)
refilled 

## 2010-11-05 ENCOUNTER — Other Ambulatory Visit: Payer: Self-pay | Admitting: Internal Medicine

## 2010-11-05 DIAGNOSIS — G4730 Sleep apnea, unspecified: Secondary | ICD-10-CM

## 2010-12-23 ENCOUNTER — Other Ambulatory Visit: Payer: Self-pay | Admitting: Internal Medicine

## 2010-12-29 ENCOUNTER — Other Ambulatory Visit: Payer: Self-pay | Admitting: Internal Medicine

## 2011-01-21 ENCOUNTER — Other Ambulatory Visit: Payer: Self-pay | Admitting: Internal Medicine

## 2011-01-25 ENCOUNTER — Other Ambulatory Visit: Payer: Self-pay | Admitting: Internal Medicine

## 2011-02-03 ENCOUNTER — Other Ambulatory Visit: Payer: Self-pay | Admitting: Internal Medicine

## 2011-04-30 ENCOUNTER — Other Ambulatory Visit: Payer: Self-pay | Admitting: Internal Medicine

## 2011-08-08 ENCOUNTER — Other Ambulatory Visit: Payer: Self-pay | Admitting: Internal Medicine

## 2011-08-10 ENCOUNTER — Other Ambulatory Visit: Payer: Self-pay | Admitting: Internal Medicine

## 2011-08-11 ENCOUNTER — Other Ambulatory Visit: Payer: Self-pay | Admitting: Internal Medicine

## 2011-09-12 ENCOUNTER — Other Ambulatory Visit: Payer: Self-pay | Admitting: Internal Medicine

## 2011-10-03 ENCOUNTER — Encounter: Payer: Self-pay | Admitting: Internal Medicine

## 2011-10-10 ENCOUNTER — Other Ambulatory Visit: Payer: Self-pay | Admitting: Internal Medicine

## 2011-10-22 ENCOUNTER — Other Ambulatory Visit: Payer: Self-pay | Admitting: Internal Medicine

## 2011-10-27 ENCOUNTER — Encounter: Payer: BC Managed Care – PPO | Admitting: Internal Medicine

## 2011-10-29 ENCOUNTER — Other Ambulatory Visit: Payer: Self-pay | Admitting: Internal Medicine

## 2011-11-01 ENCOUNTER — Other Ambulatory Visit: Payer: Self-pay | Admitting: Internal Medicine

## 2011-11-14 ENCOUNTER — Ambulatory Visit (AMBULATORY_SURGERY_CENTER): Payer: BC Managed Care – PPO | Admitting: *Deleted

## 2011-11-14 ENCOUNTER — Encounter: Payer: Self-pay | Admitting: Internal Medicine

## 2011-11-14 VITALS — Ht 72.0 in | Wt 265.0 lb

## 2011-11-14 DIAGNOSIS — Z1211 Encounter for screening for malignant neoplasm of colon: Secondary | ICD-10-CM

## 2011-11-14 MED ORDER — SUPREP BOWEL PREP KIT 17.5-3.13-1.6 GM/177ML PO SOLN: ORAL | Status: DC

## 2011-11-18 ENCOUNTER — Other Ambulatory Visit: Payer: Self-pay | Admitting: Internal Medicine

## 2011-11-27 ENCOUNTER — Encounter: Payer: BC Managed Care – PPO | Admitting: Internal Medicine

## 2011-12-02 ENCOUNTER — Other Ambulatory Visit: Payer: Self-pay | Admitting: Internal Medicine

## 2011-12-28 ENCOUNTER — Other Ambulatory Visit: Payer: Self-pay | Admitting: Internal Medicine

## 2012-01-07 ENCOUNTER — Ambulatory Visit (AMBULATORY_SURGERY_CENTER): Payer: BC Managed Care – PPO | Admitting: Internal Medicine

## 2012-01-07 ENCOUNTER — Encounter: Payer: Self-pay | Admitting: Internal Medicine

## 2012-01-07 VITALS — BP 118/70 | HR 60 | Temp 97.5°F | Resp 17 | Ht 72.0 in | Wt 265.0 lb

## 2012-01-07 DIAGNOSIS — Z1211 Encounter for screening for malignant neoplasm of colon: Secondary | ICD-10-CM

## 2012-01-07 DIAGNOSIS — D126 Benign neoplasm of colon, unspecified: Secondary | ICD-10-CM

## 2012-01-07 DIAGNOSIS — Z860100 Personal history of colon polyps, unspecified: Secondary | ICD-10-CM | POA: Insufficient documentation

## 2012-01-07 DIAGNOSIS — K648 Other hemorrhoids: Secondary | ICD-10-CM

## 2012-01-07 DIAGNOSIS — K5730 Diverticulosis of large intestine without perforation or abscess without bleeding: Secondary | ICD-10-CM

## 2012-01-07 MED ORDER — SODIUM CHLORIDE 0.9 % IV SOLN: 500.0000 mL | INTRAVENOUS | Status: DC

## 2012-01-07 NOTE — Op Note (Signed)
[facility] [address] [place], [address]   COLONOSCOPY PROCEDURE REPORT  PATIENT: Billy Todd, Billy Todd  MR#: [id number] BIRTHDATE: [birth year] , 56  yrs. old GENDER: Male ENDOSCOPIST: Iva Boop, MD, [facility] PROCEDURE DATE:  01/07/2012 PROCEDURE:   Colonoscopy with biopsy and snare polypectomy ASA CLASS:   Class II INDICATIONS:Screening and surveillance,personal history of colonic polyps and Patient's personal history of adenomatous colon polyps.  MEDICATIONS: propofol (Diprivan) 350mg  IV, MAC sedation, administered by CRNA, and These medications were titrated to patient response per physician's verbal order  DESCRIPTION OF PROCEDURE:   After the risks benefits and alternatives of the procedure were thoroughly explained, informed consent was obtained.  A digital rectal exam revealed no abnormalities of the rectum and A digital rectal exam revealed the prostate was not enlarged.   The LB [id number]  endoscope was introduced through the anus and advanced to the cecum, which was identified by both the appendix and ileocecal valve. No adverse events experienced.   The quality of the prep was Suprep excellent The instrument was then slowly withdrawn as the colon was fully examined.      COLON FINDINGS: Five diminutive polypoid shaped sessile polyps were found in the ascending colon, transverse colon, and descending colon.  A polypectomy was performed with cold forceps and with a cold snare.  The resection was complete and the polyp tissue was completely retrieved.   Mild diverticulosis was noted in the sigmoid colon.   The colon mucosa was otherwise normal. Retroflexed views right colon and rectum  revealed no abnormalities. The time to cecum=1 minutes 15 seconds.  Withdrawal time=15 minutes 14 seconds.  The scope was withdrawn and the procedure completed. COMPLICATIONS: There were no complications.  ENDOSCOPIC IMPRESSION: 1.   Five diminutive  sessile polyps were found in the ascending colon, transverse colon, and descending colon; polypectomy was performed with cold forceps and with a cold snare 2.   Mild diverticulosis was noted in the sigmoid colon 3.   The colon mucosa was otherwise normal w/ excellent prep 4.   Personal history of 3 serrated adenomas (diminutive) 2010  RECOMMENDATIONS: Timing of repeat colonoscopy will be determined by pathology findings.   eSigned:  Iva Boop, MD, [facility] 01/07/2012 10:57 AM   cc: The Patient   PATIENT NAME:  Billy Todd, Billy Todd MR#: [id number]

## 2012-01-07 NOTE — Patient Instructions (Addendum)
There were 5 small polyps removed. You also have diverticulosis.  I will send a letter about results and timing of next colonoscopy (between 3 and 5 years likely).  Thank you for choosing me and [facility].  Iva Boop, MD, FACG  YOU HAD AN ENDOSCOPIC PROCEDURE TODAY AT THE [facility]: Refer to the procedure report that was given to you for any specific questions about what was found during the examination.  If the procedure report does not answer your questions, please call your gastroenterologist to clarify.  If you requested that your care partner not be given the details of your procedure findings, then the procedure report has been included in a sealed envelope for you to review at your convenience later.  YOU SHOULD EXPECT: Some feelings of bloating in the abdomen. Passage of more gas than usual.  Walking can help get rid of the air that was put into your GI tract during the procedure and reduce the bloating. If you had a lower endoscopy (such as a colonoscopy or flexible sigmoidoscopy) you may notice spotting of blood in your stool or on the toilet paper. If you underwent a bowel prep for your procedure, then you may not have a normal bowel movement for a few days.  DIET: Your first meal following the procedure should be a light meal and then it is ok to progress to your normal diet.  A half-sandwich or bowl of soup is an example of a good first meal.  Heavy or fried foods are harder to digest and may make you feel nauseous or bloated.  Likewise meals heavy in dairy and vegetables can cause extra gas to form and this can also increase the bloating.  Drink plenty of fluids but you should avoid alcoholic beverages for 24 hours.  ACTIVITY: Your care partner should take you home directly after the procedure.  You should plan to take it easy, moving slowly for the rest of the day.  You can resume normal activity the day after the procedure however you should NOT  DRIVE or use heavy machinery for 24 hours (because of the sedation medicines used during the test).    SYMPTOMS TO REPORT IMMEDIATELY: A gastroenterologist can be reached at any hour.  During normal business hours, 8:30 AM to 5:00 PM Monday through Friday, call [phone].  After hours and on weekends, please call the GI answering service at [phone] who will take a message and have the physician on call contact you.   Following lower endoscopy (colonoscopy or flexible sigmoidoscopy):  Excessive amounts of blood in the stool  Significant tenderness or worsening of abdominal pains  Swelling of the abdomen that is new, acute  Fever of 100F or higher  Following upper endoscopy (EGD)  Vomiting of blood or coffee ground material  New chest pain or pain under the shoulder blades  Painful or persistently difficult swallowing  New shortness of breath  Fever of 100F or higher  Black, tarry-looking stools  FOLLOW UP: If any biopsies were taken you will be contacted by phone or by letter within the next 1-3 weeks.  Call your gastroenterologist if you have not heard about the biopsies in 3 weeks.  Our staff will call the home number listed on your records the next business day following your procedure to check on you and address any questions or concerns that you may have at that time regarding the information given to you following your procedure. This is a Research officer, political party  call and so if there is no answer at the home number and we have not heard from you through the emergency physician on call, we will assume that you have returned to your regular daily activities without incident.  SIGNATURES/CONFIDENTIALITY: You and/or your care partner have signed paperwork which will be entered into your electronic medical record.  These signatures attest to the fact that that the information above on your After Visit Summary has been reviewed and is understood.  Full responsibility of the confidentiality of  this discharge information lies with you and/or your care-partner.   Please follow all discharge instructions given to you by the recovery room nurse. If you have any questions or problems after discharge please call one of the numbers listed above. You will receive a phone call in the am to see how you are doing and answer any questions you may have. Thank you for choosing [facility] for your health care needs.

## 2012-01-07 NOTE — Progress Notes (Signed)
Patient did not experience any of the following events: a burn prior to discharge; a fall within the facility; wrong site/side/patient/procedure/implant event; or a hospital transfer or hospital admission upon discharge from the facility. (G8907) Patient did not have preoperative order for IV antibiotic SSI prophylaxis. (G8918)  

## 2012-01-12 ENCOUNTER — Telehealth: Payer: Self-pay | Admitting: *Deleted

## 2012-01-12 NOTE — Telephone Encounter (Signed)
  Follow up Call-  Call back number 01/07/2012  Post procedure Call Back phone  # [phone]  Permission to leave phone message Yes     Patient questions:  Do you have a fever, pain , or abdominal swelling? no Pain Score  0 *  Have you tolerated food without any problems? yes  Have you been able to return to your normal activities? yes  Do you have any questions about your discharge instructions: Diet   no Medications  no Follow up visit  no  Do you have questions or concerns about your Care? no  Actions: * If pain score is 4 or above: No action needed, pain <4.

## 2012-01-14 ENCOUNTER — Encounter: Payer: Self-pay | Admitting: Internal Medicine

## 2012-01-14 NOTE — Progress Notes (Signed)
Quick Note:  Not adenomatous (benign colon mucosa and prolapse polyps) Repeat colon 01/2017 ______

## 2012-01-21 ENCOUNTER — Other Ambulatory Visit: Payer: Self-pay | Admitting: Internal Medicine

## 2012-02-14 ENCOUNTER — Other Ambulatory Visit: Payer: Self-pay | Admitting: Internal Medicine

## 2012-03-13 ENCOUNTER — Other Ambulatory Visit: Payer: Self-pay | Admitting: Internal Medicine

## 2012-04-10 ENCOUNTER — Other Ambulatory Visit: Payer: Self-pay | Admitting: Internal Medicine

## 2012-04-17 ENCOUNTER — Other Ambulatory Visit: Payer: Self-pay | Admitting: Internal Medicine

## 2012-05-01 ENCOUNTER — Other Ambulatory Visit: Payer: Self-pay | Admitting: Internal Medicine

## 2012-05-09 ENCOUNTER — Other Ambulatory Visit: Payer: Self-pay | Admitting: Internal Medicine

## 2012-06-01 ENCOUNTER — Other Ambulatory Visit: Payer: Self-pay | Admitting: Internal Medicine

## 2012-06-20 ENCOUNTER — Other Ambulatory Visit: Payer: Self-pay | Admitting: Internal Medicine

## 2012-06-25 ENCOUNTER — Other Ambulatory Visit: Payer: Self-pay | Admitting: Internal Medicine

## 2012-07-02 ENCOUNTER — Other Ambulatory Visit: Payer: Self-pay | Admitting: Internal Medicine

## 2012-07-25 ENCOUNTER — Other Ambulatory Visit: Payer: Self-pay | Admitting: Internal Medicine

## 2012-07-26 ENCOUNTER — Other Ambulatory Visit: Payer: Self-pay | Admitting: *Deleted

## 2012-07-26 MED ORDER — DESLORATADINE 5 MG PO TABS: ORAL_TABLET | ORAL | Status: DC

## 2012-07-26 MED ORDER — ATORVASTATIN CALCIUM 20 MG PO TABS: ORAL_TABLET | ORAL | Status: DC

## 2012-08-30 ENCOUNTER — Telehealth: Payer: Self-pay | Admitting: *Deleted

## 2012-08-30 NOTE — Telephone Encounter (Signed)
error 

## 2012-09-10 ENCOUNTER — Other Ambulatory Visit: Payer: Self-pay | Admitting: *Deleted

## 2012-09-10 MED ORDER — MOMETASONE FUROATE 50 MCG/ACT NA SUSP: NASAL | Status: DC

## 2012-11-19 ENCOUNTER — Other Ambulatory Visit: Payer: Self-pay | Admitting: Internal Medicine

## 2012-12-13 ENCOUNTER — Other Ambulatory Visit: Payer: Self-pay | Admitting: Internal Medicine

## 2012-12-19 ENCOUNTER — Other Ambulatory Visit: Payer: Self-pay | Admitting: Internal Medicine

## 2013-02-10 HISTORY — PX: BACK SURGERY: SHX140

## 2013-03-27 ENCOUNTER — Emergency Department (HOSPITAL_COMMUNITY)
Admission: EM | Admit: 2013-03-27 | Discharge: 2013-03-27 | Disposition: A | Discharge status: Home / Self Care | Payer: BC Managed Care – PPO | Attending: Emergency Medicine | Admitting: Emergency Medicine

## 2013-03-27 ENCOUNTER — Encounter (HOSPITAL_COMMUNITY): Payer: Self-pay | Admitting: Emergency Medicine

## 2013-03-27 DIAGNOSIS — R5383 Other fatigue: Secondary | ICD-10-CM

## 2013-03-27 DIAGNOSIS — Z7982 Long term (current) use of aspirin: Secondary | ICD-10-CM | POA: Insufficient documentation

## 2013-03-27 DIAGNOSIS — Y929 Unspecified place or not applicable: Secondary | ICD-10-CM | POA: Insufficient documentation

## 2013-03-27 DIAGNOSIS — R110 Nausea: Secondary | ICD-10-CM | POA: Insufficient documentation

## 2013-03-27 DIAGNOSIS — E785 Hyperlipidemia, unspecified: Secondary | ICD-10-CM | POA: Insufficient documentation

## 2013-03-27 DIAGNOSIS — X500XXA Overexertion from strenuous movement or load, initial encounter: Secondary | ICD-10-CM | POA: Insufficient documentation

## 2013-03-27 DIAGNOSIS — M5430 Sciatica, unspecified side: Secondary | ICD-10-CM | POA: Insufficient documentation

## 2013-03-27 DIAGNOSIS — IMO0002 Reserved for concepts with insufficient information to code with codable children: Secondary | ICD-10-CM | POA: Insufficient documentation

## 2013-03-27 DIAGNOSIS — Z9981 Dependence on supplemental oxygen: Secondary | ICD-10-CM | POA: Insufficient documentation

## 2013-03-27 DIAGNOSIS — R5381 Other malaise: Secondary | ICD-10-CM | POA: Insufficient documentation

## 2013-03-27 DIAGNOSIS — Z79899 Other long term (current) drug therapy: Secondary | ICD-10-CM | POA: Insufficient documentation

## 2013-03-27 DIAGNOSIS — I10 Essential (primary) hypertension: Secondary | ICD-10-CM | POA: Insufficient documentation

## 2013-03-27 DIAGNOSIS — Y9389 Activity, other specified: Secondary | ICD-10-CM | POA: Insufficient documentation

## 2013-03-27 DIAGNOSIS — K219 Gastro-esophageal reflux disease without esophagitis: Secondary | ICD-10-CM | POA: Insufficient documentation

## 2013-03-27 DIAGNOSIS — G4730 Sleep apnea, unspecified: Secondary | ICD-10-CM | POA: Insufficient documentation

## 2013-03-27 DIAGNOSIS — M5442 Lumbago with sciatica, left side: Secondary | ICD-10-CM

## 2013-03-27 DIAGNOSIS — M129 Arthropathy, unspecified: Secondary | ICD-10-CM | POA: Insufficient documentation

## 2013-03-27 MED ORDER — PREDNISONE 20 MG PO TABS: 40.0000 mg | ORAL_TABLET | Freq: Every day | ORAL | Status: DC

## 2013-03-27 MED ORDER — NAPROXEN 500 MG PO TABS: 500.0000 mg | ORAL_TABLET | Freq: Two times a day (BID) | ORAL | Status: DC

## 2013-03-27 MED ORDER — HYDROCODONE-ACETAMINOPHEN 5-325 MG PO TABS: 1.0000 | ORAL_TABLET | ORAL | Status: DC | PRN

## 2013-03-27 MED ORDER — DEXAMETHASONE SODIUM PHOSPHATE 10 MG/ML IJ SOLN
10.0000 mg | Freq: Once | INTRAMUSCULAR | Status: AC
  Administered 2013-03-27: 10 mg via INTRAMUSCULAR
  Filled 2013-03-27: qty 1

## 2013-03-27 NOTE — ED Provider Notes (Signed)
CSN: [id number]     Arrival date & time 03/27/13  0430 History   First MD Initiated Contact with Patient 03/27/13 [phone]     Chief Complaint  Patient presents with  . Back Pain     (Consider location/radiation/quality/duration/timing/severity/associated sxs/prior Treatment) HPI Comments: Billy Todd is a 58 year-old male with a past medical history of GERD, HTN, Arthritis, presenting the Emergency Department with a chief complaint of low back pain for 3 days.  The patient reports gradual onset of constant left lower back pain with radiation to left lower extremity.  The patient reports movement and pressure to the area increase discomfort. He reports carrying two 5 gallon water bottles prior to the discomfort starting.  He states left lower extremity weakness, denies paresthesias.  He reports similar pain with sciatic pain several years ago.  The patient states OTC medication without relief. Denies urinary symptoms, loss of bowel function, fever or chills.  Denies history of DM. PCP: Georgetta Haber, MD    Patient is a 58 y.o. male presenting with back pain. The history is provided by the patient and medical records. No language interpreter was used.  Back Pain Associated symptoms: weakness   Associated symptoms: no abdominal pain, no dysuria and no fever     Past Medical History  Diagnosis Date  . Allergy     seasonal  . Arthritis   . GERD (gastroesophageal reflux disease)   . Hyperlipidemia   . Hypertension   . Sleep apnea     c pap   History reviewed. No pertinent past surgical history. Family History  Problem Relation Age of Onset  . Lymphoma Mother     stage 3  . Cancer Mother   . Heart disease Father   . Stroke Brother   . Heart disease Brother   . Cancer Maternal Uncle     colon cancer  . Colon cancer Maternal Uncle   . Heart disease Paternal Uncle    History  Substance Use Topics  . Smoking status: Never Smoker   . Smokeless tobacco: Never Used  .  Alcohol Use: 0.0 oz/week     Comment: rarely    Review of Systems  Constitutional: Negative for fever and chills.  Gastrointestinal: Positive for nausea. Negative for vomiting, abdominal pain, diarrhea and constipation.  Genitourinary: Negative for dysuria, flank pain, decreased urine volume and difficulty urinating.  Musculoskeletal: Positive for back pain and gait problem. Negative for neck pain and neck stiffness.  Neurological: Positive for weakness.      Allergies  Review of patient's allergies indicates no known allergies.  Home Medications   Current Outpatient Rx  Name  Route  Sig  Dispense  Refill  . amLODipine-valsartan (EXFORGE) 5-160 MG per tablet   Oral   Take 1 tablet by mouth daily.         Marland Kitchen aspirin 81 MG tablet   Oral   Take 81 mg by mouth daily.           Marland Kitchen atorvastatin (LIPITOR) 20 MG tablet   Oral   Take 20 mg by mouth daily.         Marland Kitchen desloratadine (CLARINEX) 5 MG tablet   Oral   Take 5 mg by mouth daily.         Marland Kitchen esomeprazole (NEXIUM) 40 MG capsule   Oral   Take 40 mg by mouth daily at 12 noon.         . mometasone (NASONEX) 50 MCG/ACT nasal  spray   Nasal   Place 2 sprays into the nose at bedtime.         . Multiple Vitamins-Minerals (MULTIVITAMIN WITH MINERALS) tablet   Oral   Take 1 tablet by mouth daily.         Marland Kitchen HYDROcodone-acetaminophen (NORCO/VICODIN) 5-325 MG per tablet   Oral   Take 1 tablet by mouth every 4 (four) hours as needed.   10 tablet   0   . naproxen (NAPROSYN) 500 MG tablet   Oral   Take 1 tablet (500 mg total) by mouth 2 (two) times daily. Take with food   30 tablet   0   . predniSONE (DELTASONE) 20 MG tablet   Oral   Take 2 tablets (40 mg total) by mouth daily. Take 40 mg by mouth daily for 3 days, then 20mg  by mouth daily for 3 days, then 10mg  daily for 3 days   12 tablet   0    BP 148/93  Pulse 79  Temp(Src) 97.9 F (36.6 C) (Oral)  Resp 18  SpO2 95% Physical Exam  Nursing note and  vitals reviewed. Constitutional: He is oriented to person, place, and time. He appears well-developed and well-nourished. No distress.  HENT:  Head: Normocephalic and atraumatic.  Neck: Neck supple.  Cardiovascular: Normal rate and regular rhythm.   Pulmonary/Chest: Effort normal and breath sounds normal. No respiratory distress. He has no wheezes. He has no rales.  Abdominal: Soft. He exhibits no distension. There is no tenderness. There is no rebound and no guarding.  Musculoskeletal:       Lumbar back: He exhibits tenderness. He exhibits no spasm.       Back:  No midline C-spine, T-spine, or L-spine tenderness with no step-offs, crepitus, or deformities noted.   Neurological: He is alert and oriented to person, place, and time. No sensory deficit. He exhibits normal muscle tone.  Reflex Scores:      Patellar reflexes are 2+ on the right side and 2+ on the left side.      Achilles reflexes are 1+ on the right side and 1+ on the left side. Good strength against resistance in lower extremities bilaterally.  Skin: Skin is warm and dry.  Psychiatric: He has a normal mood and affect. His behavior is normal.    ED Course  Procedures (including critical care time) Labs Review Labs Reviewed - No data to display Imaging Review No results found.  EKG Interpretation   None       MDM   Final diagnoses:  Low back pain with left-sided sciatica    Patient with left sided low back pain with radiation to left lower extremity.  Reports similar episode in the past several years ago.  No neurological deficits and normal neuro exam.  Patient can walk but states is painful.  No loss of bowel or bladder control.  No concern for cauda equina.  No fever, night sweats, weight loss, h/o cancer, IVDU.  Likely sciatica, pain medicine indicated and discussed with patient. Discussed and treatment plan with the patient. Return precautions given. Reports understanding and no other concerns at this time.   Patient is stable for discharge at this time.   Meds given in ED:  Medications  dexamethasone (DECADRON) injection 10 mg (not administered)    New Prescriptions   HYDROCODONE-ACETAMINOPHEN (NORCO/VICODIN) 5-325 MG PER TABLET    Take 1 tablet by mouth every 4 (four) hours as needed.   NAPROXEN (NAPROSYN) 500 MG TABLET  Take 1 tablet (500 mg total) by mouth 2 (two) times daily. Take with food   PREDNISONE (DELTASONE) 20 MG TABLET    Take 2 tablets (40 mg total) by mouth daily. Take 40 mg by mouth daily for 3 days, then 20mg  by mouth daily for 3 days, then 10mg  daily for 3 days      Lorrine Kin, PA-C 03/27/13 [phone]

## 2013-03-27 NOTE — ED Provider Notes (Signed)
Medical screening examination/treatment/procedure(s) were performed by non-physician practitioner and as supervising physician I was immediately available for consultation/collaboration.  EKG Interpretation   None        Jasper Riling. Alvino Chapel, MD 03/27/13 [phone]

## 2013-03-27 NOTE — Discharge Instructions (Signed)
Call for a follow up appointment with a Family or Primary Care Provider.  Return if Symptoms worsen.   Take medication as prescribed.  Take Norco as needed at night for symptom relief

## 2013-03-27 NOTE — ED Notes (Signed)
Pt states he was carrying 2 5 gallon water bottles to the office from the car and states a few hrs after he had spasms in his left lower back  Pain is radiating down his left leg now  Pt states the incident happened on Friday  Pt states it is hard for him to lift his leg  Pt states he has been using advil and aleve alternating the two but nothing seems to be helping

## 2013-03-30 ENCOUNTER — Telehealth: Payer: Self-pay | Admitting: Internal Medicine

## 2013-03-30 ENCOUNTER — Encounter: Payer: Self-pay | Admitting: Family Medicine

## 2013-03-30 ENCOUNTER — Ambulatory Visit (INDEPENDENT_AMBULATORY_CARE_PROVIDER_SITE_OTHER): Payer: BC Managed Care – PPO | Admitting: Family Medicine

## 2013-03-30 VITALS — BP 120/70 | HR 96 | Temp 98.3°F | Wt 288.0 lb

## 2013-03-30 DIAGNOSIS — IMO0002 Reserved for concepts with insufficient information to code with codable children: Secondary | ICD-10-CM

## 2013-03-30 DIAGNOSIS — M5416 Radiculopathy, lumbar region: Secondary | ICD-10-CM

## 2013-03-30 MED ORDER — METHYLPREDNISOLONE ACETATE 80 MG/ML IJ SUSP
80.0000 mg | Freq: Once | INTRAMUSCULAR | Status: AC
  Administered 2013-03-30: 80 mg via INTRAMUSCULAR

## 2013-03-30 MED ORDER — HYDROCODONE-ACETAMINOPHEN 5-325 MG PO TABS: 1.0000 | ORAL_TABLET | Freq: Four times a day (QID) | ORAL | Status: DC | PRN

## 2013-03-30 NOTE — Telephone Encounter (Signed)
Pt went to ed w/ sciatic pain issues sun. Pt is not better and wanted stat referral to neurosurgeon. Wife would like to get referral appt today.  appt set for 3 pm today w/ dr Elease Hashimoto, but pt's wife wanted me to ask you if you could get pt in any sooner to surgeon?  pls advise.

## 2013-03-30 NOTE — Patient Instructions (Addendum)
Lumbosacral Radiculopathy Lumbosacral radiculopathy is a pinched nerve or nerves in the low back (lumbosacral area). When this happens you may have weakness in your legs and may not be able to stand on your toes. You may have pain going down into your legs. There may be difficulties with walking normally. There are many causes of this problem. Sometimes this may happen from an injury, or simply from arthritis or boney problems. It may also be caused by other illnesses such as diabetes. If there is no improvement after treatment, further studies may be done to find the exact cause. DIAGNOSIS  X-rays may be needed if the problems become long standing. Electromyograms may be done. This study is one in which the working of nerves and muscles is studied. HOME CARE INSTRUCTIONS   Applications of ice packs may be helpful. Ice can be used in a plastic bag with a towel around it to prevent frostbite to skin. This may be used every 2 hours for 20 to 30 minutes, or as needed, while awake, or as directed by your caregiver.  Only take over-the-counter or prescription medicines for pain, discomfort, or fever as directed by your caregiver.  If physical therapy was prescribed, follow your caregiver's directions. SEEK IMMEDIATE MEDICAL CARE IF:   You have pain not controlled with medications.  You seem to be getting worse rather than better.  You develop increasing weakness in your legs.  You develop loss of bowel or bladder control.  You have difficulty with walking or balance, or develop clumsiness in the use of your legs.  You have a fever. MAKE SURE YOU:   Understand these instructions.  Will watch your condition.  Will get help right away if you are not doing well or get worse. Document Released: 01/27/2005 Document Revised: 04/21/2011 Document Reviewed: 09/17/2007 [facility] Patient Information 2014 [place].  We will call you with MRI scan.

## 2013-03-30 NOTE — Progress Notes (Addendum)
   Subjective:    Patient ID: Billy Todd, male    DOB: November 08, 1955, 58 y.o.   MRN: [id number]  HPI Patient is seen for emergency room followup. He presented there on 03/27/2013 with left lumbar back pain which started 3 days prior to then. Gradual onset after lifting two 5 gallon water bottles and twisting. Developed progressive left lumbar back pain with left lower extremity radiculopathy symptoms. He tried over-the-counter medications without relief. No urine or stool incontinence. Patient described some left lower weakness but no numbness at time of ER visit. His pain was moderate to severe on ER presentation. He apparently had nonfocal neurologic exam on presentation to emergency department. He was given naproxen along with oral steroid and limited hydrocodone. He was doing somewhat better until yesterday when pain exacerbated again. He's never had prior back surgery  Denies any urine or stool incontinence at this time. He has progressive weakness with left hip flexion. He's also developed some numbness involving the left upper anterior thigh.  Past Medical History  Diagnosis Date  . Allergy     seasonal  . Arthritis   . GERD (gastroesophageal reflux disease)   . Hyperlipidemia   . Hypertension   . Sleep apnea     c pap   No past surgical history on file.  reports that he has never smoked. He has never used smokeless tobacco. He reports that he drinks alcohol. He reports that he does not use illicit drugs. family history includes Cancer in his maternal uncle and mother; Colon cancer in his maternal uncle; Heart disease in his brother, father, and paternal uncle; Lymphoma in his mother; Stroke in his brother. No Known Allergies    Review of Systems  Constitutional: Negative for fever, activity change, appetite change and unexpected weight change.  Respiratory: Negative for cough and shortness of breath.   Cardiovascular: Negative for chest pain and leg swelling.    Gastrointestinal: Negative for vomiting and abdominal pain.  Genitourinary: Negative for dysuria, hematuria and flank pain.  Musculoskeletal: Positive for back pain. Negative for joint swelling.  Neurological: Positive for weakness and numbness.  Hematological: Negative for adenopathy.       Objective:   Physical Exam  Constitutional: He appears well-developed and well-nourished.  Cardiovascular: Normal rate.   Pulmonary/Chest: Breath sounds normal. No respiratory distress. He has no wheezes. He has no rales.  Musculoskeletal: He exhibits no edema.  Straight leg raise is positive on the left. No evidence for knee lower extremity muscle atrophy.  Neurological:  Patient has 2+ patella reflex on the right and 1+ on the left. 2+ Achilles reflex on the right 1+ on the left. Patient has some definite weakness with left knee extension and hip flexion compared to the right. He also has some slight weakness with left dorsi flexion compared to the right          Assessment & Plan:  Left lumbar radiculopathy. Patient has neurologic findings as above (LLE weakness and decreased left knee reflex) and progressive symptoms over the past week. ? Left L3-4 nerve compression.  No urine or stool incontinence. Continue prednisone taper. Refill hydrocodone #30 tablets given for as needed use. Schedule MRI with progressive neurologic symptoms as above.  Depo-Medrol 80 mg IM is given. He has no history of diabetes  MRI left L3 nerve compression.  Will set up neurosurgical referral.

## 2013-03-30 NOTE — Progress Notes (Signed)
Pre visit review using our clinic review tool, if applicable. No additional management support is needed unless otherwise documented below in the visit note. 

## 2013-03-31 ENCOUNTER — Telehealth: Payer: Self-pay | Admitting: Internal Medicine

## 2013-03-31 NOTE — Telephone Encounter (Signed)
Per Dr Elease Hashimoto office note, he set up a Lumbar MRI

## 2013-03-31 NOTE — Telephone Encounter (Signed)
Pt is waiting for MRI to be set up, i explained to patient that the process usually takes 3 to 5 days, he would really appreciate if he can get this done today or tomorrow.  Please advise.

## 2013-04-01 ENCOUNTER — Telehealth: Payer: Self-pay | Admitting: Internal Medicine

## 2013-04-01 NOTE — Telephone Encounter (Signed)
Left message on machine.

## 2013-04-01 NOTE — Telephone Encounter (Signed)
Pt saw dr Elease Hashimoto on 03-30-13. Pt is requesting bonnie to return his call. Pt can not sleep.

## 2013-04-02 ENCOUNTER — Ambulatory Visit
Admission: RE | Admit: 2013-04-02 | Discharge: 2013-04-02 | Disposition: A | Discharge status: Home / Self Care | Payer: BC Managed Care – PPO | Source: Ambulatory Visit | Attending: Family Medicine | Admitting: Family Medicine

## 2013-04-02 DIAGNOSIS — M5416 Radiculopathy, lumbar region: Secondary | ICD-10-CM

## 2013-04-03 NOTE — Addendum Note (Signed)
Addended by: Eulas Post on: 04/03/2013 12:59 PM   Modules accepted: Orders

## 2013-04-19 DIAGNOSIS — M5126 Other intervertebral disc displacement, lumbar region: Secondary | ICD-10-CM | POA: Insufficient documentation

## 2013-06-12 ENCOUNTER — Encounter: Payer: Self-pay | Admitting: Internal Medicine

## 2013-06-12 DIAGNOSIS — E669 Obesity, unspecified: Secondary | ICD-10-CM

## 2013-06-12 DIAGNOSIS — G4730 Sleep apnea, unspecified: Secondary | ICD-10-CM

## 2013-07-01 NOTE — Addendum Note (Signed)
Addended by: Westley Hummer B on: 07/01/2013 02:48 PM   Modules accepted: Orders

## 2013-07-01 NOTE — Telephone Encounter (Signed)
Spoke with patient and he would like a sleep study done. Referral for pulmonology done

## 2013-07-12 ENCOUNTER — Other Ambulatory Visit: Payer: Self-pay | Admitting: Internal Medicine

## 2013-07-17 ENCOUNTER — Other Ambulatory Visit: Payer: Self-pay | Admitting: Internal Medicine

## 2013-08-01 ENCOUNTER — Institutional Professional Consult (permissible substitution): Payer: BC Managed Care – PPO | Admitting: Pulmonary Disease

## 2013-08-05 ENCOUNTER — Encounter: Payer: BC Managed Care – PPO | Attending: Internal Medicine | Admitting: *Deleted

## 2013-08-05 ENCOUNTER — Encounter: Payer: Self-pay | Admitting: Internal Medicine

## 2013-08-05 ENCOUNTER — Ambulatory Visit (INDEPENDENT_AMBULATORY_CARE_PROVIDER_SITE_OTHER): Payer: BC Managed Care – PPO | Admitting: Internal Medicine

## 2013-08-05 VITALS — BP 128/84 | HR 85 | Ht 71.0 in | Wt 270.0 lb

## 2013-08-05 DIAGNOSIS — I10 Essential (primary) hypertension: Secondary | ICD-10-CM

## 2013-08-05 DIAGNOSIS — Z713 Dietary counseling and surveillance: Secondary | ICD-10-CM | POA: Insufficient documentation

## 2013-08-05 DIAGNOSIS — R635 Abnormal weight gain: Secondary | ICD-10-CM | POA: Insufficient documentation

## 2013-08-05 DIAGNOSIS — G4733 Obstructive sleep apnea (adult) (pediatric): Secondary | ICD-10-CM | POA: Insufficient documentation

## 2013-08-05 NOTE — Patient Instructions (Addendum)
Order- [facility] please help change DME to [facility], new CPAP machine, requiring small device/ battery back-up for travel, pressure 20 cwp, will need download for pressure compliance, mask of choice, humidifier, supplies     Dx OSA

## 2013-08-05 NOTE — Assessment & Plan Note (Signed)
Managed by PCP

## 2013-08-05 NOTE — Progress Notes (Signed)
08/05/13- 58 yoM never smoker COMPLAINS OF: Referred by Dr. Benay Pillow for OSA.  Pt presently on CPAP requesting to change DME from [facility] to [facility] He travels a lot, and his home loses power often, so he needs light CPAP with battery back-up. Sleep study > 56 years old, not in [place], paper chart being sought for original study if available. May need new study. Thinks CPAP 20. Depends on it all night, every night, nasal mask. No ENT surgery except tonsils. Bedtime 10:30-11:30, latency 15 min, WASO 0-1x, up 5:30. Weight up 50 lbs recent years. Rx'd HBP. Denies heart or lung disease.  Prior to Admission medications   Medication Sig Start Date End Date Taking? Authorizing Provider  amLODipine-valsartan (EXFORGE) 5-160 MG per tablet Take 1 tablet by mouth daily.   Yes Historical Provider, MD  aspirin 81 MG tablet Take 81 mg by mouth daily.     Yes Historical Provider, MD  atorvastatin (LIPITOR) 20 MG tablet TAKE 1 TABLET BY MOUTH DAILY 07/12/13  Yes Ricard Dillon, MD  desloratadine (CLARINEX) 5 MG tablet TAKE 1 TABLET BY MOUTH DAILY 07/12/13  Yes Ricard Dillon, MD  esomeprazole (NEXIUM) 40 MG capsule Take 40 mg by mouth daily at 12 noon.   Yes Historical Provider, MD  mometasone (NASONEX) 50 MCG/ACT nasal spray Place 2 sprays into the nose at bedtime.   Yes Historical Provider, MD  Multiple Vitamins-Minerals (MULTIVITAMIN WITH MINERALS) tablet Take 1 tablet by mouth daily.   Yes Historical Provider, MD   Past Medical History  Diagnosis Date  . Allergy     seasonal  . Arthritis   . GERD (gastroesophageal reflux disease)   . Hyperlipidemia   . Hypertension   . Sleep apnea     c pap   History reviewed. No pertinent past surgical history. Family History  Problem Relation Age of Onset  . Lymphoma Mother     stage 3  . Cancer Mother   . Heart disease Father   . Stroke Brother   . Heart disease Brother   . Cancer Maternal Uncle     colon cancer  . Colon cancer Maternal Uncle   .  Heart disease Paternal Uncle    History   Social History  . Marital Status: Married    Spouse Name: N/A    Number of Children: N/A  . Years of Education: N/A   Occupational History  . Not on file.   Social History Main Topics  . Smoking status: Never Smoker   . Smokeless tobacco: Never Used  . Alcohol Use: 0.0 oz/week     Comment: rarely  . Drug Use: No  . Sexual Activity: Yes   Other Topics Concern  . Not on file   Social History Narrative   Regular Exercise- no   ROS-see HPI Constitutional:   No-   weight loss, night sweats, fevers, chills, fatigue, lassitude. HEENT:   No-  headaches, difficulty swallowing, tooth/dental problems, sore throat,       No-  sneezing, itching, ear ache, nasal congestion, post nasal drip,  CV:  No-   chest pain, orthopnea, PND, swelling in lower extremities, anasarca,                                  dizziness, palpitations Resp: No-   shortness of breath with exertion or at rest.  No-   productive cough,  No non-productive cough,  No- coughing up of blood.              No-   change in color of mucus.  No- wheezing.   Skin: No-   rash or lesions. GI:  No-   heartburn, indigestion, abdominal pain, nausea, vomiting, diarrhea,                 change in bowel habits, loss of appetite GU: No-   dysuria, change in color of urine, no urgency or frequency.  No- flank pain. MS:  No-   joint pain or swelling.  No- decreased range of motion.  No- back pain. Neuro-     nothing unusual Psych:  No- change in mood or affect. No depression or anxiety.  No memory loss.  OBJ- Physical Exam General- Alert, Oriented, Affect-appropriate, Distress- none acute, +overweight Skin- rash-none, lesions- none, excoriation- none Lymphadenopathy- none Head- atraumatic            Eyes- Gross vision intact, PERRLA, conjunctivae and secretions clear            Ears- Hearing, canals-normal            Nose- Clear, no-Septal dev, mucus, polyps, erosion,  perforation             Throat- Mallampati III , mucosa clear , drainage- none, tonsils- atrophic Neck- flexible , trachea midline, no stridor , thyroid nl, carotid no bruit Chest - symmetrical excursion , unlabored           Heart/CV- RRR , no murmur , no gallop  , no rub, nl s1 s2                           - JVD- none , edema- none, stasis changes- none, varices- none           Lung- clear to P&A, wheeze- none, cough- none , dullness-none, rub- none           Chest wall-  Abd- tender-no, distended-no, bowel sounds-present, HSM- no Br/ Gen/ Rectal- Not done, not indicated Extrem- cyanosis- none, clubbing, none, atrophy- none, strength- nl Neuro- grossly intact to observation

## 2013-08-05 NOTE — Assessment & Plan Note (Signed)
Weight loss counseled

## 2013-08-05 NOTE — Progress Notes (Signed)
Medical Nutrition Therapy:  Appt start time: 0800 end time:  0900.  Assessment:  Patient here today for weight management. Patient reports that he has gained about 50 pounds in the last year due to sedentary lifestyle (busy work schedule) and recent back surgery. He started a 9 day cleanse diet this Sunday, and has lost about 5 pounds since then (starting weight 275 pounds, current weight 269.9 pounds). This diet involves high intake of vegetables, some fruit, and protein once daily. We discussed the importance of balanced meals and higher protein intake during weight loss. He understands this, and plans to incorporate more foods into the diet after the 9 days. He does want to continue limiting carbs in his diet, but recognizes the need to have some healthy carbs in the diet to spare protein. He has been walking about 2 miles 3-4 days weekly now.   MEDICATIONS: See list   DIETARY INTAKE:   Usual eating pattern includes 3 meals and 1 snacks per day.  24-hr recall:  B ( AM): Smoothie (2 c spinach, strawberries, blueberries, coconut milk)  Snk ( AM): None  L ( PM): Cucumber, avocado, lettuce salad, oil/vinegar Snk ( PM): Raw vegetables D ( PM): 5 oz chicken/fish, broccoli Snk ( PM): None Beverages: Water, hot tea, black coffee  Prior to cleanse, patient eating: B: 2 scrambled eggs with peppers and onions, sausage patty L: Deli meat ([place], pastrami) dipped in mustard or mayo, fruit D: Large portions of meat (6-9 ounces), baked potato, vegetables S: at work, trail mix with nuts and cranberries  Usual physical activity: 2 miles walking 3-4 days weekly  Estimated energy needs: 1800 calories 203 g carbohydrates 113 g protein 60 g fat  Progress Towards Goal(s):  In progress.   Nutritional Diagnosis:  [place]-3.3 Overweight/obesity As related to excessive portion size and physical inactivity.  As evidenced by BMI > 30.    Intervention:  Nutrition counseling. We discussed strategies for weight  loss, including balancing nutrients (carbs, protein, fat), portion control, healthy snacks, and exercise. We discussed the importance of eating balanced meals, getting adequate protein, and continuing to have a high intake of non-starchy vegetables.   Goals:  1. 1-2 pounds weight loss per week. Short term goal weight: 245-250 pounds in 4-6 months.  2. Balance meals including a moderate portion of lean meat, moderate portion of healthy carbs, and non-starchy vegetables.  3. Monitor portion size.  4. Consume a diet moderate in fat.  5. Continue exercising as currently, increase as able given recent back surgery.  Handouts given during visit include:  Weight loss tips  Meal plan card  Monitoring/Evaluation:  Dietary intake, exercise, and body weight in 1 month(s).

## 2013-08-17 ENCOUNTER — Telehealth: Payer: Self-pay | Admitting: Internal Medicine

## 2013-08-17 DIAGNOSIS — G4733 Obstructive sleep apnea (adult) (pediatric): Secondary | ICD-10-CM

## 2013-08-17 NOTE — Telephone Encounter (Signed)
Pt called back; he is aware that he needs a new sleep study. I have placed order to [facility]'s inbasket. Pt will await a call back from our [facility] to give him the date/time and location of sleep study appointment. Nothing more needed at this time.

## 2013-09-16 ENCOUNTER — Telehealth: Payer: Self-pay | Admitting: Internal Medicine

## 2013-09-16 NOTE — Telephone Encounter (Signed)
Pt is requesting an update & asks to be reached at [phone].  Billy Todd

## 2013-09-16 NOTE — Telephone Encounter (Signed)
LMOM TCB x1 for [email or web address]  Pt seen for sleep consult on 6.26.15: Patient Instructions     [facility] CPAP machine, requiring small device/ battery back-up for travel, pressure 20 cwp, will need download for pressure compliance, mask of choice, humidifier, supplies Dx OSA   Called spoke with patient who reported that he had spoken with Nic earlier today who reported that the original order was never received from 6.26.15.  Pt will be going out of town next week but [facility] will overnight the CPAP.  Pt asks that we call Nic at [phone] and/or fax the order from the ov.  Advised pt will print the order, fax it to provided number then call Nic.  Called spoke with Nic, who reported the order from last ov will suffice if we can fax it.  While on the phone with Nic he received the order I had already faxed and reiterated that this will work and he will contact pt about sending out his CPAP.  Called spoke patient and provided update.  Pt is aware Nic will contact him shortly.  Nothing further needed; will sign off.

## 2013-09-23 ENCOUNTER — Ambulatory Visit: Payer: BC Managed Care – PPO | Admitting: *Deleted

## 2013-10-06 ENCOUNTER — Ambulatory Visit (HOSPITAL_BASED_OUTPATIENT_CLINIC_OR_DEPARTMENT_OTHER): Payer: BC Managed Care – PPO | Attending: Internal Medicine

## 2013-10-06 VITALS — Ht 71.0 in | Wt 250.0 lb

## 2013-10-06 DIAGNOSIS — G4733 Obstructive sleep apnea (adult) (pediatric): Secondary | ICD-10-CM | POA: Insufficient documentation

## 2013-10-06 DIAGNOSIS — G4730 Sleep apnea, unspecified: Secondary | ICD-10-CM | POA: Diagnosis present

## 2013-10-06 DIAGNOSIS — R0989 Other specified symptoms and signs involving the circulatory and respiratory systems: Secondary | ICD-10-CM | POA: Insufficient documentation

## 2013-10-06 DIAGNOSIS — G4710 Hypersomnia, unspecified: Secondary | ICD-10-CM | POA: Diagnosis present

## 2013-10-06 DIAGNOSIS — R0609 Other forms of dyspnea: Secondary | ICD-10-CM | POA: Insufficient documentation

## 2013-10-07 ENCOUNTER — Encounter: Payer: Self-pay | Admitting: *Deleted

## 2013-10-07 ENCOUNTER — Ambulatory Visit (INDEPENDENT_AMBULATORY_CARE_PROVIDER_SITE_OTHER): Payer: BC Managed Care – PPO | Admitting: Internal Medicine

## 2013-10-07 ENCOUNTER — Encounter: Payer: Self-pay | Admitting: Internal Medicine

## 2013-10-07 ENCOUNTER — Encounter: Payer: BC Managed Care – PPO | Attending: Internal Medicine | Admitting: *Deleted

## 2013-10-07 VITALS — BP 112/62 | HR 70 | Ht 71.0 in | Wt 259.0 lb

## 2013-10-07 DIAGNOSIS — Z713 Dietary counseling and surveillance: Secondary | ICD-10-CM | POA: Insufficient documentation

## 2013-10-07 DIAGNOSIS — R635 Abnormal weight gain: Secondary | ICD-10-CM | POA: Insufficient documentation

## 2013-10-07 DIAGNOSIS — G4733 Obstructive sleep apnea (adult) (pediatric): Secondary | ICD-10-CM

## 2013-10-07 DIAGNOSIS — I10 Essential (primary) hypertension: Secondary | ICD-10-CM

## 2013-10-07 NOTE — Assessment & Plan Note (Signed)
Discussed interaction of HBP with OSA

## 2013-10-07 NOTE — Assessment & Plan Note (Signed)
When we have results of NPSG, hopefully we can go forward with pressure setting by using a DME that can hancle his [facility] portable CPAP as well. He will check that list for a site convenient to him, and let us know.

## 2013-10-07 NOTE — Assessment & Plan Note (Signed)
Weight loss will help OSA and HBP

## 2013-10-07 NOTE — Progress Notes (Signed)
08/05/13- 23 yoM never smoker COMPLAINS OF: Referred by Dr. Benay Pillow for OSA.  Pt presently on CPAP requesting to change DME from [facility] to [facility] He travels a lot, and his home loses power often, so he needs light CPAP with battery back-up. Sleep study > 56 years old, not in [place], paper chart being sought for original study if available. May need new study. Thinks CPAP 20. Depends on it all night, every night, nasal mask. No ENT surgery except tonsils. Bedtime 10:30-11:30, latency 15 min, WASO 0-1x, up 5:30. Weight up 50 lbs recent years. Rx'd HBP. Denies heart or lung disease  10/07/13- 58 yoM never smoker followed for OSA, complicated by obesity, HBP, allergic rhinitis FOLLOWS FOR: review sleep study with patient(done 10-06-13); has been set up with CPAP through [email or web address] Will need help with setting CPAP pressures. Had NPSG last night- not yet available, to requalify for CPAP. Now has his lighter travel CPAP from [place], set on auto. Equally comfortable as his old machine.[facility] gave him list of DMEs that can service machine so he is going to establish with one of these to get both machines set the same. Not comfortable sleeping w/o CPAP every night- sleep study was difficult.  ROS-see HPI Constitutional:   No-   weight loss, night sweats, fevers, chills, fatigue, lassitude. HEENT:   No-  headaches, difficulty swallowing, tooth/dental problems, sore throat,       No-  sneezing, itching, ear ache, nasal congestion, post nasal drip,  CV:  No-   chest pain, orthopnea, PND, swelling in lower extremities, anasarca,                                  dizziness, palpitations Resp: No-   shortness of breath with exertion or at rest.              No-   productive cough,  No non-productive cough,  No- coughing up of blood.              No-   change in color of mucus.  No- wheezing.   Skin: No-   rash or lesions. GI:  No-   heartburn, indigestion, abdominal pain, nausea, vomiting,  GU:   MS:  No-   joint pain or swelling.  in. Neuro-     nothing unusual Psych:  No- change in mood or affect. No depression or anxiety.  No memory loss.  OBJ- Physical Exam General- Alert, Oriented, Affect-appropriate, Distress- none acute, +overweight Skin- rash-none, lesions- none, excoriation- none Lymphadenopathy- none Head- atraumatic            Eyes- Gross vision intact, PERRLA, conjunctivae and secretions clear            Ears- Hearing, canals-normal            Nose- Clear, no-Septal dev, mucus, polyps, erosion, perforation             Throat- Mallampati III , mucosa clear , drainage- none, tonsils- atrophic Neck- flexible , trachea midline, no stridor , thyroid nl, carotid no bruit Chest - symmetrical excursion , unlabored           Heart/CV- RRR , no murmur , no gallop  , no rub, nl s1 s2                           - JVD- none , edema-  none, stasis changes- none, varices- none           Lung- clear to P&A, wheeze- none, cough- none , dullness-none, rub- none           Chest wall-  Abd-  Br/ Gen/ Rectal- Not done, not indicated Extrem- cyanosis- none, clubbing, none, atrophy- none, strength- nl Neuro- grossly intact to observation

## 2013-10-07 NOTE — Patient Instructions (Signed)
We will score your sleep study from last night, so documentation is available. You can call us in a week if you haven't heard the report by then.   Please identify a durable medical equipment company (DME) from the list supplied by your CPAP manufacturer. We can generate a prescription to have them service both of your machines, help with pressure settings, supplies and documentation for insurance as needed. We will be able to choose pressure settings once we have this information.

## 2013-10-07 NOTE — Progress Notes (Signed)
Medical Nutrition Therapy:  Appt start time: 0800 end time:  0830.  Assessment:  Patient returns for a follow up for weight management. His weight is down to 257.5 pounds from 269.9 pounds 2 months ago. He continues to follow the "Beyond Diet Plan". He is currently in a 30 day portion, which is a balanced eating plan with a mix of lean protein, and small portions of healthy carbs. He is eating about 5-6 times daily. This plan is working for him, and is a healthy, balanced program of about 1800-2000 kcal daily. It also allows him a "cheat" day every week. He reports that he feels like weight loss has stalled, but this may be a reflection of slowed weight loss. He was advised to weight himself once weekly to measure progress, with a goal of 1 pound weight loss per week. He continues to walk 2-2.5 miles, increased to 6 days weekly. He has also started to golf more since back has improved since surgery, and is doing a calisthenics-type program with stretching and body weight exercises.   MEDICATIONS: See list   DIETARY INTAKE:   Usual eating pattern includes 3 meals and 2-3 snacks per day.  24-hr recall:  B ( AM): Smoothie (banana, peanut butter, almond milk) OR eggs, bacon  Snk ( AM): Zucchini muffin L ( PM): Same as dinner the night before Snk ( PM): 2 oz deli [place], cucumber D ( PM): White meat (chicken, fish)/occasional beef/bison meat, vegetables/salad, small portion of rice/quinoa pasta/etc. Snk ( PM): Yogurt, cottage cheese, fruit, dark chocolate Beverages: Water, hot tea, black coffee  Usual physical activity: 2-2.5 miles walking 6 days weekly, calisthenics 3 days/week, golf  Estimated energy needs: 1800 calories 203 g carbohydrates 113 g protein 60 g fat  Progress Towards Goal(s):  In progress.   Nutritional Diagnosis:  Price-3.3 Overweight/obesity As related to excessive portion size and physical inactivity. As evidenced by BMI > 30, improved    Intervention:  Nutrition counseling.  Patient praised on current efforts to lose weight. We discussed realistic weight loss expectations, and patient advised to weigh himself once a week to determine progress. If weight loss stalled, patient advised to 1) cut back slightly on current diet plan, 2) increase intensity of exercise as time is limited, 3) return to 14 day phase of Beyond Diet plan. If this does not work, patient instructed to work on weight maintenance for 1 month before starting to diet again.    Monitoring/Evaluation:  Dietary intake, exercise, and body weight in 2 month(s).

## 2013-10-15 DIAGNOSIS — G4733 Obstructive sleep apnea (adult) (pediatric): Secondary | ICD-10-CM

## 2013-10-15 NOTE — Sleep Study (Signed)
   NAME: Billy Todd [facility] DATE OF BIRTH:  11-Apr-1955 MEDICAL RECORD NUMBER [id number]  LOCATION: [facility]  PHYSICIAN: Divine Imber D  DATE OF STUDY: 10/06/2013  SLEEP STUDY TYPE: Nocturnal Polysomnogram               REFERRING PHYSICIAN: Baird Lyons D, MD  INDICATION FOR STUDY: Hypersomnia with sleep apnea  EPWORTH SLEEPINESS SCORE:   14/24 HEIGHT: 5\' 11"  (180.3 cm)  WEIGHT: 250 lb (113.399 kg)    Body mass index is 34.88 kg/(m^2).  NECK SIZE: 17 in.  MEDICATIONS: Charted for review  SLEEP ARCHITECTURE: Total sleep time 342 minutes with sleep efficiency 90.1%. Stage I was 1.9%, stage II 91.8%, stage III 0.1%, REM 6.1% of total sleep time. Sleep latency 1.5 minutes, REM latency 244.5 minutes, awake after sleep onset 36 minutes, arousal index 4.9, bedtime medication: Tylenol  RESPIRATORY DATA: Apnea hypopneas index (AHI) 24.7 per hour. 141 total events scored including 41 obstructive apneas, 8 central apneas, 19 mixed apneas, 73 hypopneas. Events were not positional. REM AHI 25.7 per hour. Most events develop after 1 AM and there were not enough early events to allow split protocol CPAP titration.  OXYGEN DATA: Very loud snoring with oxygen desaturation to a nadir of 81% and mean saturation 90.5% on room air.  CARDIAC DATA: Normal sinus rhythm  MOVEMENT/PARASOMNIA: No significant movement disturbance, bathroom x1  IMPRESSION/ RECOMMENDATION:   1) Moderate obstructive sleep apnea/hypopneas syndrome, AHI 24.7 per hour with non-positional events. Very loud snoring with oxygen desaturation to a nadir of 81% and mean saturation 90.5% on room air. 2) CPAP therapy is usually preferred for management of scores in this range.   [place], American Board of Sleep Medicine  ELECTRONICALLY SIGNED ON:  10/15/2013, 9:15 AM [place] PH: [phone]   FX: [phone] [place]

## 2013-10-25 ENCOUNTER — Other Ambulatory Visit: Payer: Self-pay | Admitting: Internal Medicine

## 2013-12-02 ENCOUNTER — Ambulatory Visit: Payer: BC Managed Care – PPO | Admitting: Dietician

## 2013-12-02 ENCOUNTER — Encounter: Payer: Self-pay | Admitting: Internal Medicine

## 2013-12-02 ENCOUNTER — Ambulatory Visit (INDEPENDENT_AMBULATORY_CARE_PROVIDER_SITE_OTHER): Payer: BC Managed Care – PPO | Admitting: Internal Medicine

## 2013-12-02 VITALS — BP 110/70 | HR 74 | Ht 71.0 in | Wt 257.0 lb

## 2013-12-02 DIAGNOSIS — G4733 Obstructive sleep apnea (adult) (pediatric): Secondary | ICD-10-CM

## 2013-12-02 DIAGNOSIS — Z23 Encounter for immunization: Secondary | ICD-10-CM

## 2013-12-02 NOTE — Patient Instructions (Signed)
Flu vax  Order- DME Advanced download CPAP for pressure compliance recommendation  Dx OSA

## 2013-12-02 NOTE — Progress Notes (Signed)
08/05/13- 60 yoM never smoker COMPLAINS OF: Referred by Dr. Benay Pillow for OSA.  Pt presently on CPAP requesting to change DME from [facility] to [facility] He travels a lot, and his home loses power often, so he needs light CPAP with battery back-up. Sleep study > 56 years old, not in [place], paper chart being sought for original study if available. May need new study. Thinks CPAP 20. Depends on it all night, every night, nasal mask. No ENT surgery except tonsils. Bedtime 10:30-11:30, latency 15 min, WASO 0-1x, up 5:30. Weight up 50 lbs recent years. Rx'd HBP. Denies heart or lung disease  10/07/13- 58 yoM never smoker followed for OSA, complicated by obesity, HBP, allergic rhinitis FOLLOWS FOR: review sleep study with patient(done 10-06-13); has been set up with CPAP through [email or web address] Will need help with setting CPAP pressures. Had NPSG last night- not yet available, to requalify for CPAP. Now has his lighter travel CPAP from [place], set on auto. Equally comfortable as his old machine.[facility] gave him list of DMEs that can service machine so he is going to establish with one of these to get both machines set the same. Not comfortable sleeping w/o CPAP every night- sleep study was difficult.  12/02/13- 69 yoM never smoker followed for OSA, complicated by obesity, HBP, allergic rhinitis FOLLOWS FOR: Patient using CPAP auto every night 6-8 hours no problems. NPSG 10/06/13- moderate OSA, AHI 24.7/ hr, loud snoring, weight 250 pounds Likes his new [facility] travel CPAP. He is using a home device measuring sleep stages. We discussed normal pattern.  ROS-see HPI Constitutional:   No-   weight loss, night sweats, fevers, chills, fatigue, lassitude. HEENT:   No-  headaches, difficulty swallowing, tooth/dental problems, sore throat,       No-  sneezing, itching, ear ache, nasal congestion, post nasal drip,  CV:  No-   chest pain, orthopnea, PND, swelling in lower extremities, anasarca,                                   dizziness, palpitations Resp: No-   shortness of breath with exertion or at rest.              No-   productive cough,  No non-productive cough,  No- coughing up of blood.              No-   change in color of mucus.  No- wheezing.   Skin: No-   rash or lesions. GI:  No-   heartburn, indigestion, abdominal pain, nausea, vomiting,  GU:  MS:  No-   joint pain or swelling.   Neuro-     nothing unusual Psych:  No- change in mood or affect. No depression or anxiety.  No memory loss.  OBJ- Physical Exam General- Alert, Oriented, Affect-appropriate, Distress- none acute, +overweight Skin- rash-none, lesions- none, excoriation- none Lymphadenopathy- none Head- atraumatic            Eyes- Gross vision intact, PERRLA, conjunctivae and secretions clear            Ears- Hearing, canals-normal            Nose- Clear, no-Septal dev, mucus, polyps, erosion, perforation             Throat- Mallampati III , mucosa clear , drainage- none, tonsils- atrophic Neck- flexible , trachea midline, no stridor , thyroid nl, carotid no bruit Chest - symmetrical excursion ,  unlabored           Heart/CV- RRR , no murmur , no gallop  , no rub, nl s1 s2                           - JVD- none , edema- none, stasis changes- none, varices- none           Lung- clear to P&A, wheeze- none, cough- none , dullness-none, rub- none           Chest wall-  Abd-  Br/ Gen/ Rectal- Not done, not indicated Extrem- cyanosis- none, clubbing, none, atrophy- none, strength- nl Neuro- grossly intact to observation

## 2013-12-05 ENCOUNTER — Telehealth: Payer: Self-pay | Admitting: Internal Medicine

## 2013-12-05 NOTE — Telephone Encounter (Signed)
[id number] - [place], [address] AT [address] is requesting re-fill on FLUTICASONE 50 MCG

## 2013-12-06 ENCOUNTER — Telehealth: Payer: Self-pay | Admitting: Internal Medicine

## 2013-12-06 MED ORDER — DESLORATADINE 5 MG PO TABS: ORAL_TABLET | ORAL | Status: DC

## 2013-12-06 MED ORDER — MOMETASONE FUROATE 50 MCG/ACT NA SUSP: 2.0000 | Freq: Every day | NASAL | Status: DC

## 2013-12-06 NOTE — Telephone Encounter (Signed)
[id number] - [place], [address] AT [address] is requesting re-fill on desloratadine (CLARINEX) 5 MG tablet

## 2013-12-06 NOTE — Telephone Encounter (Signed)
rx sent in electronically.  Pt has an appt with Dr Yong Channel on 10/30

## 2013-12-06 NOTE — Telephone Encounter (Signed)
rx sent in electronically 

## 2013-12-09 ENCOUNTER — Ambulatory Visit (INDEPENDENT_AMBULATORY_CARE_PROVIDER_SITE_OTHER): Payer: BC Managed Care – PPO | Admitting: Family Medicine

## 2013-12-09 ENCOUNTER — Encounter: Payer: Self-pay | Admitting: Family Medicine

## 2013-12-09 VITALS — BP 112/72 | Temp 98.2°F | Wt 250.0 lb

## 2013-12-09 DIAGNOSIS — I10 Essential (primary) hypertension: Secondary | ICD-10-CM

## 2013-12-09 DIAGNOSIS — E785 Hyperlipidemia, unspecified: Secondary | ICD-10-CM

## 2013-12-09 NOTE — Assessment & Plan Note (Signed)
Continue atorvastatin 20 mg. Discussed getting lab work today and patient request doing this at his next physical. He will return 1 week before his physical to get blood work and we will discuss at next visit

## 2013-12-09 NOTE — Progress Notes (Signed)
Billy Reddish, MD Phone: [phone]  Subjective:  Patient presents today to establish care with me as their new primary care provider. Patient was formerly a patient of Dr. Arnoldo Morale. Chief complaint-noted.   Hypertension-well controlled BP Readings from Last 3 Encounters:  12/09/13 112/72  12/02/13 110/70  10/07/13 112/62  Home BP monitoring-no Exercise-Walking 2 miles a day. Has lost 35 lbs in last 3 months. Does some leg strength training.  Compliant with medications-yes without side effects, doing well on Exforge ROS-Denies any CP, HA, SOB, blurry vision, LE edema, transient weakness, orthopnea, PND.   Hyperlipidemia-difficult to assess control but has been stable on statin and suspect reasonable control Lab Results  Component Value Date   LDLCALC 103* 02/23/2009  On statin: Atorvastatin 20 mg Regular exercise: As above ROS- no chest pain or shortness of breath. No myalgias  Other ROS- Patient updates me on the following: Back surgery in March for bulging disc with nerve damage. Numbness in left leg with some weakness lingering from surgery. Tingling slowly improving but taking longer than expected. Worked with PT.     The following were reviewed and entered/updated in epic: Past Medical History  Diagnosis Date  . Allergy     seasonal  . Arthritis   . GERD (gastroesophageal reflux disease)   . Hyperlipidemia   . Hypertension   . Sleep apnea     c pap   Patient Active Problem List   Diagnosis Date Noted  . Obstructive sleep apnea 08/05/2013    Priority: Medium  . Hyperlipidemia 02/23/2007    Priority: Medium  . Essential hypertension 02/23/2007    Priority: Medium  . Personal history of adenomatous colonic polyps 01/07/2012    Priority: Low  . Obesity 03/02/2009    Priority: Low  . Allergic rhinitis 02/23/2007    Priority: Low  . GERD 02/23/2007    Priority: Low   Past Surgical History  Procedure Laterality Date  . Back surgery  2015  . Other  1971      tonsilectomy    Family History  Problem Relation Age of Onset  . Lymphoma Mother     stage 3  . Cancer Mother     eventually leukemia  . Heart disease Father 31    first event and led to death  . Colon cancer Maternal Uncle   . Heart disease Paternal Uncle   . Diabetes Mother   . Cancer Sister     blood/spleen related    Medications- reviewed and updated Current Outpatient Prescriptions  Medication Sig Dispense Refill  . amLODipine-valsartan (EXFORGE) 5-160 MG per tablet Take 1 tablet by mouth daily.      Marland Kitchen aspirin 81 MG tablet Take 81 mg by mouth daily.        Marland Kitchen atorvastatin (LIPITOR) 20 MG tablet TAKE 1 TABLET BY MOUTH EVERY DAY  30 tablet  1  . desloratadine (CLARINEX) 5 MG tablet TAKE 1 TABLET BY MOUTH DAILY  30 tablet  0  . Misc Natural Products (OSTEO BI-FLEX ADV JOINT SHIELD) TABS Take 1 tablet by mouth daily.      . Multiple Vitamins-Minerals (MULTIVITAMIN WITH MINERALS) tablet Take 1 tablet by mouth daily.      Marland Kitchen NEXIUM 40 MG capsule TAKE 1 CAPSULE BY MOUTH EVERY DAY BEFORE BREAKFAST  30 capsule  1  . mometasone (NASONEX) 50 MCG/ACT nasal spray Place 2 sprays into the nose at bedtime.  17 g  0   No current facility-administered medications for this visit.  Allergies-reviewed and updated Allergies  Allergen Reactions  . Shellfish Allergy Other (See Comments)    Sore throat    History   Social History  . Marital Status: Married    Spouse Name: N/A    Number of Children: N/A  . Years of Education: N/A   Social History Main Topics  . Smoking status: Never Smoker   . Smokeless tobacco: Never Used  . Alcohol Use: 0.0 oz/week     Comment: rarely  . Drug Use: No  . Sexual Activity: Yes   Other Topics Concern  . None   Social History Narrative   Married 1990. 1 daughter.       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       Hobbies: golf, sports, tv             ROS--See HPI   Objective: BP 112/72  Temp(Src) 98.2 F (36.8 C)  Wt 250  lb (113.399 kg) Gen: NAD, resting comfortably in chair CV: RRR no murmurs rubs or gallops Lungs: CTAB no crackles, wheeze, rhonchi Abdomen: soft/nontender/nondistended/normal bowel sounds.  Ext: trace edema, 2+ DP pulses Skin: warm, dry, no rash Neuro: grossly normal, moves all extremities   Assessment/Plan:  Essential hypertension Well controlled on amlodipine-losartan. Continue current medications.  Hyperlipidemia Continue atorvastatin 20 mg. Discussed getting lab work today and patient request doing this at his next physical. He will return 1 week before his physical to get blood work and we will discuss at next visit

## 2013-12-09 NOTE — Assessment & Plan Note (Signed)
Well controlled on amlodipine-losartan. Continue current medications.

## 2013-12-09 NOTE — Patient Instructions (Signed)
Blood pressure looks great today.   See me in 6 or so months for a physical and get bloodwork at least a few days before and we can discuss at your visit.

## 2013-12-11 NOTE — Assessment & Plan Note (Signed)
He describes good compliance control with C Pap. We need download for pressure decision, meanwhile he continues autotitration/Advanced

## 2013-12-13 ENCOUNTER — Telehealth: Payer: Self-pay | Admitting: Family Medicine

## 2013-12-13 NOTE — Telephone Encounter (Signed)
[id number] - [place], [address] AT [address] is requesting re-fill on amLODipine-valsartan (EXFORGE) 5-160 MG per tablet

## 2013-12-14 MED ORDER — AMLODIPINE BESYLATE-VALSARTAN 5-160 MG PO TABS: 1.0000 | ORAL_TABLET | Freq: Every day | ORAL | Status: DC

## 2013-12-14 NOTE — Telephone Encounter (Signed)
Medication refilled

## 2013-12-15 ENCOUNTER — Other Ambulatory Visit: Payer: Self-pay | Admitting: Family Medicine

## 2013-12-15 MED ORDER — ESOMEPRAZOLE MAGNESIUM 40 MG PO CPDR: DELAYED_RELEASE_CAPSULE | ORAL | Status: DC

## 2013-12-15 NOTE — Telephone Encounter (Signed)
[id number] - [place], [address] AT [address] is requesting re-fill on NEXIUM 40 MG capsule

## 2013-12-15 NOTE — Telephone Encounter (Signed)
Rx sent to pharmacy   

## 2013-12-20 ENCOUNTER — Telehealth: Payer: Self-pay | Admitting: Family Medicine

## 2013-12-20 NOTE — Telephone Encounter (Signed)
[id number] - [place], [place] - [address] is requesting re-fill on atorvastatin (LIPITOR) 20 MG tablet ° °

## 2013-12-21 MED ORDER — ATORVASTATIN CALCIUM 20 MG PO TABS: 20.0000 mg | ORAL_TABLET | Freq: Every day | ORAL | Status: DC

## 2013-12-21 NOTE — Telephone Encounter (Signed)
Medication refilled

## 2014-01-19 ENCOUNTER — Encounter: Payer: Self-pay | Admitting: Family Medicine

## 2014-01-23 ENCOUNTER — Telehealth: Payer: Self-pay

## 2014-01-23 MED ORDER — DESLORATADINE 5 MG PO TABS: ORAL_TABLET | ORAL | Status: DC

## 2014-01-23 NOTE — Telephone Encounter (Signed)
Medication refilled

## 2014-01-23 NOTE — Telephone Encounter (Signed)
Rx request for desloratadine 5 mg tablets-Take 1 tablet by mouth every day #30  Pharm:  [place] advise.

## 2014-02-07 ENCOUNTER — Other Ambulatory Visit: Payer: Self-pay | Admitting: Family Medicine

## 2014-03-18 ENCOUNTER — Other Ambulatory Visit: Payer: Self-pay | Admitting: Family Medicine

## 2014-04-13 ENCOUNTER — Other Ambulatory Visit: Payer: Self-pay | Admitting: Family Medicine

## 2014-04-25 ENCOUNTER — Other Ambulatory Visit: Payer: Self-pay | Admitting: Family Medicine

## 2014-05-02 ENCOUNTER — Other Ambulatory Visit: Payer: Self-pay

## 2014-05-04 ENCOUNTER — Other Ambulatory Visit: Payer: Self-pay

## 2014-05-05 ENCOUNTER — Other Ambulatory Visit: Payer: BC Managed Care – PPO

## 2014-05-09 ENCOUNTER — Other Ambulatory Visit (INDEPENDENT_AMBULATORY_CARE_PROVIDER_SITE_OTHER): Payer: BLUE CROSS/BLUE SHIELD

## 2014-05-09 DIAGNOSIS — Z0000 Encounter for general adult medical examination without abnormal findings: Secondary | ICD-10-CM

## 2014-05-09 LAB — CBC WITH DIFFERENTIAL/PLATELET
BASOS PCT: 0.9 % (ref 0.0–3.0)
Basophils Absolute: 0.1 10*3/uL (ref 0.0–0.1)
EOS ABS: 0.4 10*3/uL (ref 0.0–0.7)
EOS PCT: 7.2 % — AB (ref 0.0–5.0)
HCT: 41.8 % (ref 39.0–52.0)
HEMOGLOBIN: 14.2 g/dL (ref 13.0–17.0)
LYMPHS PCT: 34.3 % (ref 12.0–46.0)
Lymphs Abs: 2.1 10*3/uL (ref 0.7–4.0)
MCHC: 34 g/dL (ref 30.0–36.0)
MCV: 88.5 fl (ref 78.0–100.0)
MONO ABS: 0.5 10*3/uL (ref 0.1–1.0)
MONOS PCT: 8 % (ref 3.0–12.0)
NEUTROS ABS: 3.1 10*3/uL (ref 1.4–7.7)
NEUTROS PCT: 49.6 % (ref 43.0–77.0)
Platelets: 237 10*3/uL (ref 150.0–400.0)
RBC: 4.72 Mil/uL (ref 4.22–5.81)
RDW: 13.4 % (ref 11.5–15.5)
WBC: 6.2 10*3/uL (ref 4.0–10.5)

## 2014-05-09 LAB — BASIC METABOLIC PANEL
BUN: 23 mg/dL (ref 6–23)
CO2: 28 mEq/L (ref 19–32)
Calcium: 9.2 mg/dL (ref 8.4–10.5)
Chloride: 107 mEq/L (ref 96–112)
Creatinine, Ser: 1.03 mg/dL (ref 0.40–1.50)
GFR: 79.16 mL/min (ref >60.00)
Glucose, Bld: 106 mg/dL — ABNORMAL HIGH (ref 70–99)
POTASSIUM: 4.6 meq/L (ref 3.5–5.1)
Sodium: 140 mEq/L (ref 135–145)

## 2014-05-09 LAB — POCT URINALYSIS DIPSTICK
Bilirubin, UA: NEGATIVE
Blood, UA: NEGATIVE
GLUCOSE UA: NEGATIVE
Ketones, UA: NEGATIVE
LEUKOCYTES UA: NEGATIVE
Nitrite, UA: NEGATIVE
PH UA: 6.5
PROTEIN UA: NEGATIVE
Spec Grav, UA: 1.02
UROBILINOGEN UA: 0.2

## 2014-05-09 LAB — HEPATIC FUNCTION PANEL
ALBUMIN: 3.7 g/dL (ref 3.5–5.2)
ALT: 21 U/L (ref 0–53)
AST: 18 U/L (ref 0–37)
Alkaline Phosphatase: 45 U/L (ref 39–117)
BILIRUBIN TOTAL: 0.4 mg/dL (ref 0.2–1.2)
Bilirubin, Direct: 0.1 mg/dL (ref 0.0–0.3)
Total Protein: 6.3 g/dL (ref 6.0–8.3)

## 2014-05-09 LAB — LIPID PANEL
CHOLESTEROL: 159 mg/dL (ref 0–200)
HDL: 46.4 mg/dL (ref >39.00)
LDL CALC: 93 mg/dL (ref 0–99)
NonHDL: 112.6
TRIGLYCERIDES: 96 mg/dL (ref 0.0–149.0)
Total CHOL/HDL Ratio: 3
VLDL: 19.2 mg/dL (ref 0.0–40.0)

## 2014-05-09 LAB — TSH: TSH: 2.74 u[IU]/mL (ref 0.35–4.50)

## 2014-05-09 LAB — PSA: PSA: 1.57 ng/mL (ref 0.10–4.00)

## 2014-05-10 ENCOUNTER — Other Ambulatory Visit: Payer: Self-pay | Admitting: Family Medicine

## 2014-05-11 ENCOUNTER — Encounter: Payer: BC Managed Care – PPO | Admitting: Family Medicine

## 2014-05-12 ENCOUNTER — Telehealth: Payer: Self-pay

## 2014-05-12 ENCOUNTER — Encounter: Payer: Self-pay | Admitting: Family Medicine

## 2014-05-12 ENCOUNTER — Ambulatory Visit (INDEPENDENT_AMBULATORY_CARE_PROVIDER_SITE_OTHER): Payer: BLUE CROSS/BLUE SHIELD | Admitting: Family Medicine

## 2014-05-12 VITALS — BP 120/68 | HR 71 | Temp 99.3°F | Wt 260.0 lb

## 2014-05-12 DIAGNOSIS — R739 Hyperglycemia, unspecified: Secondary | ICD-10-CM | POA: Insufficient documentation

## 2014-05-12 DIAGNOSIS — M25561 Pain in right knee: Secondary | ICD-10-CM

## 2014-05-12 DIAGNOSIS — Z0000 Encounter for general adult medical examination without abnormal findings: Secondary | ICD-10-CM | POA: Diagnosis not present

## 2014-05-12 MED ORDER — MOMETASONE FUROATE 50 MCG/ACT NA SUSP: 2.0000 | Freq: Every day | NASAL | Status: DC

## 2014-05-12 NOTE — Patient Instructions (Addendum)
BP looks great  Cholesterol looks great  Sugar a hair high but you have greater concerns right now.   Let's check in 6-12 months from now but definitely a physical in a year.   We will call you within a week about your referral to sports medicine. If you do not hear within 2 weeks, give [place] a call.

## 2014-05-12 NOTE — Telephone Encounter (Signed)
[facility] refill request for NASONEX 50MCG (17G)

## 2014-05-12 NOTE — Telephone Encounter (Signed)
Medication refilled

## 2014-05-12 NOTE — Progress Notes (Signed)
Garret Reddish, MD Phone: [phone]  Subjective:  Patient presents today for their annual physical. Chief complaint-noted.   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 Difficulty sleeping as a result. In and out of hospitals all over country-trying to get into clinical trial. Wife 8 years ago misdiagnosed with liver cancer.   BP controlled on exforge Lipids controlled on atorvastatin 20mg .  Sleep apnea on cpap but trouble sleeping as above.   R knee swelling starting in January. Intermittent pain and feelings of instability. Denies injury or fall. Pain at the worst up to 6-7/10, now down to 3/10.   ROS- full  review of systems was completed and negative except for: knee pain as above, difficulty sleeping due to stress  The following were reviewed and entered/updated in epic: Past Medical History  Diagnosis Date  . Allergy     seasonal  . Arthritis   . GERD (gastroesophageal reflux disease)   . Hyperlipidemia   . Hypertension   . Sleep apnea     c pap   Patient Active Problem List   Diagnosis Date Noted  . Hyperglycemia 05/12/2014    Priority: Medium  . Obstructive sleep apnea 08/05/2013    Priority: Medium  . Obesity 03/02/2009    Priority: Medium  . Hyperlipidemia 02/23/2007    Priority: Medium  . Essential hypertension 02/23/2007    Priority: Medium  . Personal history of adenomatous colonic polyps 01/07/2012    Priority: Low  . Allergic rhinitis 02/23/2007    Priority: Low  . GERD 02/23/2007    Priority: Low   Past Surgical History  Procedure Laterality Date  . Back surgery  2015  . Other  1971    tonsilectomy    Family History  Problem Relation Age of Onset  . Lymphoma Mother     stage 3  . Cancer Mother     eventually leukemia  . Heart disease Father 64    first event and led to death  . Colon cancer Maternal Uncle   . Heart disease Paternal Uncle   . Diabetes Mother   . Cancer Sister     blood/spleen related    Medications- reviewed and  updated Current Outpatient Prescriptions  Medication Sig Dispense Refill  . amLODipine-valsartan (EXFORGE) 5-160 MG per tablet TAKE 1 TABLET BY MOUTH EVERY DAY 30 tablet 5  . aspirin 81 MG tablet Take 81 mg by mouth daily.      Marland Kitchen atorvastatin (LIPITOR) 20 MG tablet TAKE 1 TABLET BY MOUTH EVERY DAY AT 6PM 30 tablet 5  . desloratadine (CLARINEX) 5 MG tablet TAKE 1 TABLET BY MOUTH EVERY DAY 30 tablet 2  . esomeprazole (NEXIUM) 40 MG capsule TAKE 1 CAPSULE BY MOUTH EVERY DAY BEFORE BREAKFAST 30 capsule 5  . Misc Natural Products (OSTEO BI-FLEX ADV JOINT SHIELD) TABS Take 1 tablet by mouth daily.    . mometasone (NASONEX) 50 MCG/ACT nasal spray Place 2 sprays into the nose at bedtime. 17 g 5  . Multiple Vitamins-Minerals (MULTIVITAMIN WITH MINERALS) tablet Take 1 tablet by mouth daily.      Allergies-reviewed and updated Allergies  Allergen Reactions  . Shellfish Allergy Other (See Comments)    Sore throat    History   Social History  . Marital Status: Married    Spouse Name: N/A  . Number of Children: N/A  . Years of Education: N/A   Social History Main Topics  . Smoking status: Never Smoker   . Smokeless tobacco: Never Used  .  Alcohol Use: 0.0 oz/week     Comment: rarely  . Drug Use: No  . Sexual Activity: Yes   Other Topics Concern  . None   Social History Narrative   Married 1990. Wife with bile duct cancer misdiagnosed in 2008, recurred 2016, < 1 year life expectancy. 1 daughter.       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       Hobbies: golf, sports, tv             ROS--See HPI   Objective: BP 120/68 mmHg  Pulse 71  Temp(Src) 99.3 F (37.4 C)  Wt 260 lb (117.935 kg) Gen: NAD, resting comfortably on table HEENT: Mucous membranes are moist. Oropharynx normal. Good dentition with several crowns.  Neck: no thyromegaly or lymphadenopathy CV: RRR no murmurs rubs or gallops Lungs: CTAB no crackles, wheeze, rhonchi Abdomen:  soft/nontender/nondistended/normal bowel sounds. No rebound or guarding.  Rectal: the prostate is of normal size and consistency, nontender, & without nodules Ext: no edema Skin: warm, dry, no rash or concerning lesions for AK, SCC, BCC, melanoma Neuro: grossly normal, moves all extremities, PERRLA  Right Knee (left completely normal exam): Normal to inspection with no erythema or effusion or obvious bony abnormalities. Palpation normal with no warmth or joint line tenderness or patellar tenderness or condyle tenderness. ROM normal in flexion and extension and lower leg rotation. Ligaments with solid consistent endpoints including ACL, PCL, LCL, MCL. Mcmurray's produces pain but no obvious click on medial meniscus exam.  Normal strength in  Lower extremities  Assessment/Plan:  59 y.o. male presenting for annual physical.  Health Maintenance counseling: 1. Anticipatory guidance: Patient counseled regarding regular dental exams, regular eye exams with Dr. Herbert Deaner, wearing seatbelts, wearing sunscreen. Does not see dermatology currently.  2. Risk factor reduction:  Advised patient of need for regular exercise and diet rich and fruits and vegetables to reduce risk of heart attack and stroke.  3. Immunizations/screenings/ancillary studies Health Maintenance Due  Topic Date Due  . HIV Screening - advised in future  07[birth year]1972  4. Reviewed labs-main concern is hyperglycemia  Right knee pain- refer to Dr. Tamala Julian of sports medicine. Potential degenerative meniscal tear. Patient wanted to have further confirmation with consideration of ultrasound before pursuing steroid injection which I offered today.   Orders Placed This Encounter  Procedures  . Ambulatory referral to Sports Medicine    Referral Priority:  Routine    Referral Type:  Consultation    Referred to Provider:  Lyndal Pulley, DO    Number of Visits Requested:  1

## 2014-05-16 ENCOUNTER — Encounter: Payer: Self-pay | Admitting: Family Medicine

## 2014-05-21 ENCOUNTER — Other Ambulatory Visit: Payer: Self-pay | Admitting: Family Medicine

## 2014-05-31 ENCOUNTER — Ambulatory Visit: Payer: BLUE CROSS/BLUE SHIELD | Admitting: Family Medicine

## 2014-06-09 ENCOUNTER — Other Ambulatory Visit (INDEPENDENT_AMBULATORY_CARE_PROVIDER_SITE_OTHER): Payer: BLUE CROSS/BLUE SHIELD

## 2014-06-09 ENCOUNTER — Ambulatory Visit (INDEPENDENT_AMBULATORY_CARE_PROVIDER_SITE_OTHER)
Admission: RE | Admit: 2014-06-09 | Discharge: 2014-06-09 | Disposition: A | Discharge status: Home / Self Care | Payer: BLUE CROSS/BLUE SHIELD | Source: Ambulatory Visit | Attending: Family Medicine | Admitting: Family Medicine

## 2014-06-09 ENCOUNTER — Ambulatory Visit: Payer: BLUE CROSS/BLUE SHIELD | Admitting: Family Medicine

## 2014-06-09 ENCOUNTER — Encounter: Payer: Self-pay | Admitting: Family Medicine

## 2014-06-09 ENCOUNTER — Ambulatory Visit (INDEPENDENT_AMBULATORY_CARE_PROVIDER_SITE_OTHER): Payer: BLUE CROSS/BLUE SHIELD | Admitting: Family Medicine

## 2014-06-09 VITALS — BP 132/82 | HR 74 | Ht 71.0 in | Wt 260.0 lb

## 2014-06-09 DIAGNOSIS — S83411A Sprain of medial collateral ligament of right knee, initial encounter: Secondary | ICD-10-CM

## 2014-06-09 DIAGNOSIS — M25561 Pain in right knee: Secondary | ICD-10-CM

## 2014-06-09 DIAGNOSIS — S83419A Sprain of medial collateral ligament of unspecified knee, initial encounter: Secondary | ICD-10-CM | POA: Insufficient documentation

## 2014-06-09 DIAGNOSIS — S83249A Other tear of medial meniscus, current injury, unspecified knee, initial encounter: Secondary | ICD-10-CM | POA: Insufficient documentation

## 2014-06-09 DIAGNOSIS — S83241A Other tear of medial meniscus, current injury, right knee, initial encounter: Secondary | ICD-10-CM

## 2014-06-09 MED ORDER — MELOXICAM 15 MG PO TABS: 15.0000 mg | ORAL_TABLET | Freq: Every day | ORAL | Status: DC

## 2014-06-09 NOTE — Progress Notes (Signed)
Pre visit review using our clinic review tool, if applicable. No additional management support is needed unless otherwise documented below in the visit note. 

## 2014-06-09 NOTE — Assessment & Plan Note (Signed)
Patient also has a medial meniscal tear. I do not think that this will heal until patient's MCL heals. Patient was put in a playmaker brace to give him more stability. Patient will do oral anti-inflammatories for pain as well and icing protocol. Patient given topical anti-inflammatories a try as well. Patient will do and icing protocol and come back and see me again in 3 weeks for further evaluation and treatment.

## 2014-06-09 NOTE — Progress Notes (Signed)
[facility] [address] [place], [place] [address] Phone: [phone] Subjective:    I'm seeing this patient by the request  of:  Garret Reddish, MD   CC: Right knee pain and swelling  [id number] Billy Todd is a 59 y.o. male coming in with complaint of right knee pain and swelling. Patient was seen by primary care provider. Patient states that this started in January. Patient states that this was more of an intermittent pain and sometimes feelings of instability. States that it seems becoming more frequent. Does not remember any specific injury or fall. States that the pain can get as high as 7 out of 10 but at rest more 3 out of 10. Patient is concerned because sometimes it feels like the knee would potentially give out on him. Denies any radiation down the leg or any numbness or what he would consider weakness of the knee. Denies any nighttime awakening. Patient has not been taking care of himself and gaining weight because he is been taking care of his wife who has cancer.  Past Medical History  Diagnosis Date  . Allergy     seasonal  . Arthritis   . GERD (gastroesophageal reflux disease)   . Hyperlipidemia   . Hypertension   . Sleep apnea     c pap   Past Surgical History  Procedure Laterality Date  . Back surgery  2015  . Other  1971    tonsilectomy   Allergies  Allergen Reactions  . Shellfish Allergy Other (See Comments)    Sore throat   Family History  Problem Relation Age of Onset  . Lymphoma Mother     stage 3  . Cancer Mother     eventually leukemia  . Heart disease Father 62    first event and led to death  . Colon cancer Maternal Uncle   . Heart disease Paternal Uncle   . Diabetes Mother   . Cancer Sister     blood/spleen related   History   Social History  . Marital Status: Married    Spouse Name: N/A  . Number of Children: N/A  . Years of Education: N/A   Social History Main Topics  . Smoking status:  Never Smoker   . Smokeless tobacco: Never Used  . Alcohol Use: 0.0 oz/week     Comment: rarely  . Drug Use: No  . Sexual Activity: Yes   Other Topics Concern  . None   Social History Narrative   Married 1990. Wife with bile duct cancer misdiagnosed in 2008, recurred 2016, < 1 year life expectancy. 1 daughter.       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       Hobbies: golf, sports, tv               Past medical history, social, surgical and family history all reviewed in electronic medical record.   Review of Systems: No headache, visual changes, nausea, vomiting, diarrhea, constipation, dizziness, abdominal pain, skin rash, fevers, chills, night sweats, weight loss, swollen lymph nodes, body aches, joint swelling, muscle aches, chest pain, shortness of breath, mood changes.   Objective Blood pressure 132/82, pulse 74, height 5\' 11"  (1.803 m), weight 260 lb (117.935 kg), SpO2 96 %.  General: No apparent distress alert and oriented x3 mood and affect normal, dressed appropriately.  HEENT: Pupils equal, extraocular movements intact  Respiratory: Patient's speak in full sentences and does not appear short of  breath  Cardiovascular: No lower extremity edema, non tender, no erythema  Skin: Warm dry intact with no signs of infection or rash on extremities or on axial skeleton.  Abdomen: Soft nontender  Neuro: Cranial nerves II through XII are intact, neurovascularly intact in all extremities with 2+ DTRs and 2+ pulses.  Lymph: No lymphadenopathy of posterior or anterior cervical chain or axillae bilaterally.  Gait normal with good balance and coordination.  MSK:  Non tender with full range of motion and good stability and symmetric strength and tone of shoulders, elbows, wrist, hip, and ankles bilaterally.  Knee: Right Normal to inspection with no erythema or effusion or obvious bony abnormalities. Palpation normal with no warmth, joint line tenderness, patellar tenderness, or  condyle tenderness. ROM full in flexion and extension and lower leg rotation. Ligaments with solid consistent endpoints including ACL, PCL, LCL, MCL. Negative Mcmurray's, Apley's, and Thessalonian tests. Non painful patellar compression. Patellar glide without crepitus. Patellar and quadriceps tendons unremarkable. Hamstring and quadriceps strength is normal.  Lateral knee unremarkable   MSK US performed of: Right knee This study was ordered, performed, and interpreted by Charlann Boxer D.O.  Knee: All structures visualized. Patient's anterior medial and posterior medial meniscus does have a tear with no significant display spine. Seems about 60% of the tendon. Patellar Tendon unremarkable on long and transverse views without effusion. No abnormality of prepatellar bursa. Patient does have tear of the MCL of the interbody part of the 50%. Minimal healing noted. Increasing Doppler flow as well as hypoechoic changes still existing. gastrocnemius.  IMPRESSION:  MCL tear 50% with no retraction and MCL tear with no displacement   Procedure: Real-time Ultrasound Guided Injection of right knee Device: GE Logiq E  Ultrasound guided injection is preferred based studies that show increased duration, increased effect, greater accuracy, decreased procedural pain, increased response rate, and decreased cost with ultrasound guided versus blind injection.  Verbal informed consent obtained.  Time-out conducted.  Noted no overlying erythema, induration, or other signs of local infection.  Skin prepped in a sterile fashion.  Local anesthesia: Topical Ethyl chloride.  With sterile technique and under real time ultrasound guidance: With a 22-gauge 2 inch needle patient was injected with 4 cc of 0.5% Marcaine and 1 cc of Kenalog 40 mg/dL. This was from a superior lateral approach.  Completed without difficulty  Pain immediately resolved suggesting accurate placement of the medication.  Advised to call if  fevers/chills, erythema, induration, drainage, or persistent bleeding.  Images permanently stored and available for review in the ultrasound unit.  Impression: Technically successful ultrasound guided injection.    procedure note 09381; 15 minutes spent for Therapeutic exercises as stated in above notes.  This included exercises focusing on stretching, strengthening, with significant focus on eccentric aspects. Patient given exercises to work with hip abductor's as well as strengthening exercises for more of the medial aspect of the knee. We discussed avoiding any type of twisting motions. Patient will work on strengthening the quadriceps and hamstrings as well.   Proper technique shown and discussed handout in great detail with ATC.  All questions were discussed and answered.   Impression and Recommendations:     This case required medical decision making of moderate complexity.

## 2014-06-09 NOTE — Patient Instructions (Addendum)
Good to see you.  MCL and meniscal injury.  Ice 20 minutes 2 times daily. Usually after activity and before bed. Exercises 3 times a week.  Wear brace  We will do injection today as well Ok to bike or to do elliptical  Try meloxicam daily for 10 days then as needed See me again in 3-4 weeks.

## 2014-06-09 NOTE — Assessment & Plan Note (Signed)
She does have a tear of the MCL. Approximately 50% of it seems to be torn. Based on patient's presentation this is been going on for months. We discussed different treatment options and patient elected to try an injection, home exercises, bracing, and avoiding some activities. We will see how patient response to this and have patient come back again in 3-4 weeks. At that time we should consider also another ultrasound.

## 2014-06-18 ENCOUNTER — Other Ambulatory Visit: Payer: Self-pay | Admitting: Family Medicine

## 2014-07-07 ENCOUNTER — Other Ambulatory Visit: Payer: Self-pay | Admitting: Family Medicine

## 2014-07-07 ENCOUNTER — Ambulatory Visit: Payer: BLUE CROSS/BLUE SHIELD | Admitting: Family Medicine

## 2014-07-07 NOTE — Telephone Encounter (Signed)
Refill done.  

## 2014-07-21 ENCOUNTER — Ambulatory Visit (INDEPENDENT_AMBULATORY_CARE_PROVIDER_SITE_OTHER): Payer: BLUE CROSS/BLUE SHIELD | Admitting: Family Medicine

## 2014-07-21 ENCOUNTER — Encounter: Payer: Self-pay | Admitting: Family Medicine

## 2014-07-21 ENCOUNTER — Other Ambulatory Visit (INDEPENDENT_AMBULATORY_CARE_PROVIDER_SITE_OTHER): Payer: BLUE CROSS/BLUE SHIELD

## 2014-07-21 VITALS — BP 122/84 | HR 73 | Ht 71.0 in | Wt 268.0 lb

## 2014-07-21 DIAGNOSIS — S83241D Other tear of medial meniscus, current injury, right knee, subsequent encounter: Secondary | ICD-10-CM | POA: Diagnosis not present

## 2014-07-21 DIAGNOSIS — S83411A Sprain of medial collateral ligament of right knee, initial encounter: Secondary | ICD-10-CM

## 2014-07-21 DIAGNOSIS — M7552 Bursitis of left shoulder: Secondary | ICD-10-CM | POA: Diagnosis not present

## 2014-07-21 DIAGNOSIS — M7550 Bursitis of unspecified shoulder: Secondary | ICD-10-CM | POA: Insufficient documentation

## 2014-07-21 DIAGNOSIS — R200 Anesthesia of skin: Secondary | ICD-10-CM | POA: Insufficient documentation

## 2014-07-21 DIAGNOSIS — R208 Other disturbances of skin sensation: Secondary | ICD-10-CM

## 2014-07-21 NOTE — Patient Instructions (Signed)
Good to see you You are doing great Ice is your friend Wear brace with a lot of activity 6 weeks.  No jumping or deep squats 3 weeks.  b12 1032mcg daily B6 200mg  daily If not perfect see me again in 4 weeks.

## 2014-07-21 NOTE — Assessment & Plan Note (Signed)
She is doing significantly better at this time. Encourage patient to continue home exercises as well as the bracing with increased activity. We discussed avoidance of jumping or any type of 20 motion. Patient will try these different changes and come back and see me again in 4-6 weeks for further evaluation and treatment.

## 2014-07-21 NOTE — Assessment & Plan Note (Signed)
Given an injection today. We discussed icing, home exercises, and discussed what activities to potentially avoid. Patient will do home exercises and patient was given handout today. Patient and will come back and see me again in 3-4 weeks.

## 2014-07-21 NOTE — Progress Notes (Signed)
Pre visit review using our clinic review tool, if applicable. No additional management support is needed unless otherwise documented below in the visit note. 

## 2014-07-21 NOTE — Assessment & Plan Note (Signed)
Proving at this time. Encourage him to avoid any type of rotational component at this time or any deep squats. Patient will continue with the home exercises. Patient come back and see me again in 4-6 weeks for further evaluation and treatment.

## 2014-07-21 NOTE — Progress Notes (Signed)
[facility] [place], [place] [address] Phone: [phone] Subjective:    I'm seeing this patient by the request  of:  Garret Reddish, MD   CC: Right knee pain and swelling  [id number] Billy Todd is a 59 y.o. male coming in with complaint of right knee pain and swelling. Patient was seen previously was diagnosed with a 50% MCL tear as well as a medial meniscal injury. Patient was given an injection as well as a brace. Patient didn't home exercises. Patient states his knee is doing much better. Patient was states that he is 70% better. Denies any numbness or any instability. Patient states that he is doing relatively well overall.  he is complaining of a new problem. Patient is stating that his left shoulders turning. Some decreased range of motion. Difficult to reach behind his back or over his head. Rates the severity of 7 out of 10. Dull throbbing aching pain with no radiation down the arm. Has tried some IV probing with minimal benefit.  Patient also has a history of herniated disc that needed surgical repair on L3-L4 on the left side. Patient has constant numbness since this surgery. Patient did have some nerve damage. Patient is wondering if there is anything else that can be done.  Past Medical History  Diagnosis Date  . Allergy     seasonal  . Arthritis   . GERD (gastroesophageal reflux disease)   . Hyperlipidemia   . Hypertension   . Sleep apnea     c pap   Past Surgical History  Procedure Laterality Date  . Back surgery  2015  . Other  1971    tonsilectomy   Allergies  Allergen Reactions  . Shellfish Allergy Other (See Comments)    Sore throat   Family History  Problem Relation Age of Onset  . Lymphoma Mother     stage 3  . Cancer Mother     eventually leukemia  . Heart disease Father 45    first event and led to death  . Colon cancer Maternal Uncle   . Heart disease Paternal Uncle   . Diabetes Mother   .  Cancer Sister     blood/spleen related   History   Social History  . Marital Status: Married    Spouse Name: N/A  . Number of Children: N/A  . Years of Education: N/A   Social History Main Topics  . Smoking status: Never Smoker   . Smokeless tobacco: Never Used  . Alcohol Use: 0.0 oz/week     Comment: rarely  . Drug Use: No  . Sexual Activity: Yes   Other Topics Concern  . None   Social History Narrative   Married 1990. Wife with bile duct cancer misdiagnosed in 2008, recurred 2016, < 1 year life expectancy. 1 daughter.       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       Hobbies: golf, sports, tv               Past medical history, social, surgical and family history all reviewed in electronic medical record.   Review of Systems: No headache, visual changes, nausea, vomiting, diarrhea, constipation, dizziness, abdominal pain, skin rash, fevers, chills, night sweats, weight loss, swollen lymph nodes, body aches, joint swelling, muscle aches, chest pain, shortness of breath, mood changes.   Objective Blood pressure 122/84, pulse 73, weight 268 lb (121.564 kg), SpO2 94 %.  General:  No apparent distress alert and oriented x3 mood and affect normal, dressed appropriately.  HEENT: Pupils equal, extraocular movements intact  Respiratory: Patient's speak in full sentences and does not appear short of breath  Cardiovascular: No lower extremity edema, non tender, no erythema  Skin: Warm dry intact with no signs of infection or rash on extremities or on axial skeleton.  Abdomen: Soft nontender  Neuro: Cranial nerves II through XII are intact, neurovascularly intact in all extremities with 2+ DTRs and 2+ pulses.  Lymph: No lymphadenopathy of posterior or anterior cervical chain or axillae bilaterally.  Gait normal with good balance and coordination.  MSK:  Non tender with full range of motion and good stability and symmetric strength and tone of shoulders, elbows, wrist,  hip, and ankles bilaterally.  Knee: Right Normal to inspection with no erythema or effusion or obvious bony abnormalities. Palpation normal with no warmth, joint line tenderness, patellar tenderness, or condyle tenderness. ROM full in flexion and extension and lower leg rotation. Ligaments with solid consistent endpoints including ACL, PCL, LCL, MCL. Negative Mcmurray's, Apley's, and Thessalonian tests. Non painful patellar compression. Patellar glide without crepitus. Patellar and quadriceps tendons unremarkable. Hamstring and quadriceps strength is normal.  Contralateral knee unremarkable   MSK US performed of: Right knee This study was ordered, performed, and interpreted by Charlann Boxer D.O.  Knee: All structures visualized. Patient's anterior medial and posterior medial meniscus does have a tear with no significant display spine. Seems about 10% of meniscus that is improving Patellar Tendon unremarkable on long and transverse views without effusion. No abnormality of prepatellar bursa. Patient does have tear of the MCL of the interbody part of the 10%. Significant improvement from previous exam.  IMPRESSION:  MCL tear 10% with no retraction and meniscal injury healing slowly      Shoulder: Left Inspection reveals no abnormalities, atrophy or asymmetry. Palpation is normal with no tenderness over AC joint or bicipital groove. ROM is full in all planes passively. Rotator cuff strength normal throughout. signs of impingement with positive Neer and Hawkin's tests, but negative empty can sign. Speeds and Yergason's tests normal. No labral pathology noted with negative Obrien's, negative clunk and good stability. Normal scapular function observed. No painful arc and no drop arm sign. No apprehension sign Contralateral shoulder unremarkable.  MSK US performed of: Left This study was ordered, performed, and interpreted by Charlann Boxer D.O.  Shoulder:   Supraspinatus:  Appears  normal on long and transverse views, Bursal bulge seen with shoulder abduction on impingement view. Infraspinatus:  Appears normal on long and transverse views. Significant increase in Doppler flow Subscapularis:  Appears normal on long and transverse views. Positive bursa Teres Minor:  Appears normal on long and transverse views. AC joint:  Capsule undistended, no geyser sign. Glenohumeral Joint:  Appears normal without effusion. Glenoid Labrum:  Intact without visualized tears. Biceps Tendon:  Appears normal on long and transverse views, no fraying of tendon, tendon located in intertubercular groove, no subluxation with shoulder internal or external rotation.  Impression: Subacromial bursitis  Procedure: Real-time Ultrasound Guided Injection of left glenohumeral joint Device: GE Logiq E  Ultrasound guided injection is preferred based studies that show increased duration, increased effect, greater accuracy, decreased procedural pain, increased response rate with ultrasound guided versus blind injection.  Verbal informed consent obtained.  Time-out conducted.  Noted no overlying erythema, induration, or other signs of local infection.  Skin prepped in a sterile fashion.  Local anesthesia: Topical Ethyl chloride.  With sterile  technique and under real time ultrasound guidance:  Joint visualized.  23g 1  inch needle inserted posterior approach. Pictures taken for needle placement. Patient did have injection of 2 cc of 1% lidocaine, 2 cc of 0.5% Marcaine, and 1.0 cc of Kenalog 40 mg/dL. Completed without difficulty  Pain immediately resolved suggesting accurate placement of the medication.  Advised to call if fevers/chills, erythema, induration, drainage, or persistent bleeding.  Images permanently stored and available for review in the ultrasound unit.  Impression: Technically successful ultrasound guided injection.   Impression and Recommendations:     This case required medical  decision making of moderate complexity.

## 2014-08-18 ENCOUNTER — Ambulatory Visit: Payer: BLUE CROSS/BLUE SHIELD | Admitting: Family Medicine

## 2014-08-18 DIAGNOSIS — Z0289 Encounter for other administrative examinations: Secondary | ICD-10-CM

## 2014-09-01 ENCOUNTER — Other Ambulatory Visit: Payer: Self-pay | Admitting: Family Medicine

## 2014-09-04 NOTE — Telephone Encounter (Signed)
Refill done.  

## 2014-10-02 ENCOUNTER — Other Ambulatory Visit: Payer: Self-pay | Admitting: Family Medicine

## 2014-10-02 NOTE — Telephone Encounter (Signed)
Refill done.  

## 2014-10-14 ENCOUNTER — Other Ambulatory Visit: Payer: Self-pay | Admitting: Family Medicine

## 2014-10-29 ENCOUNTER — Other Ambulatory Visit: Payer: Self-pay | Admitting: Family Medicine

## 2014-10-30 ENCOUNTER — Other Ambulatory Visit: Payer: Self-pay | Admitting: Family Medicine

## 2014-10-30 NOTE — Telephone Encounter (Signed)
Refill done.  

## 2014-11-02 ENCOUNTER — Other Ambulatory Visit: Payer: Self-pay | Admitting: Family Medicine

## 2014-11-05 ENCOUNTER — Encounter: Payer: Self-pay | Admitting: Internal Medicine

## 2014-11-27 ENCOUNTER — Other Ambulatory Visit: Payer: Self-pay | Admitting: Family Medicine

## 2014-11-27 NOTE — Telephone Encounter (Signed)
Refill done.  

## 2014-12-08 ENCOUNTER — Ambulatory Visit (INDEPENDENT_AMBULATORY_CARE_PROVIDER_SITE_OTHER): Payer: BLUE CROSS/BLUE SHIELD | Admitting: Internal Medicine

## 2014-12-08 ENCOUNTER — Encounter: Payer: Self-pay | Admitting: Internal Medicine

## 2014-12-08 VITALS — BP 122/72 | HR 73 | Ht 71.0 in | Wt 275.6 lb

## 2014-12-08 DIAGNOSIS — G4733 Obstructive sleep apnea (adult) (pediatric): Secondary | ICD-10-CM

## 2014-12-08 DIAGNOSIS — Z23 Encounter for immunization: Secondary | ICD-10-CM

## 2014-12-08 DIAGNOSIS — E669 Obesity, unspecified: Secondary | ICD-10-CM

## 2014-12-08 NOTE — Progress Notes (Signed)
08/05/13- 59 yoM never smoker COMPLAINS OF: Referred by Dr. Benay Pillow for OSA.  Pt presently on CPAP requesting to change DME from [facility] to [facility] He travels a lot, and his home loses power often, so he needs light CPAP with battery back-up. Sleep study > 57 years old, not in [place], paper chart being sought for original study if available. May need new study. Thinks CPAP 20. Depends on it all night, every night, nasal mask. No ENT surgery except tonsils. Bedtime 10:30-11:30, latency 15 min, WASO 0-1x, up 5:30. Weight up 50 lbs recent years. Rx'd HBP. Denies heart or lung disease  10/07/13- 59 yoM never smoker followed for OSA, complicated by obesity, HBP, allergic rhinitis FOLLOWS FOR: review sleep study with patient(done 10-06-13); has been set up with CPAP through [email or web address] Will need help with setting CPAP pressures. Had NPSG last night- not yet available, to requalify for CPAP. Now has his lighter travel CPAP from [place], set on auto. Equally comfortable as his old machine.[facility] gave him list of DMEs that can service machine so he is going to establish with one of these to get both machines set the same. Not comfortable sleeping w/o CPAP every night- sleep study was difficult.  12/02/13- 59 yoM never smoker followed for OSA, complicated by obesity, HBP, allergic rhinitis FOLLOWS FOR: Patient using CPAP auto every night 6-8 hours no problems. NPSG 10/06/13- moderate OSA, AHI 24.7/ hr, loud snoring, weight 250 pounds Likes his new [facility] travel CPAP. He is using a home device measuring sleep stages. We discussed normal pattern.  12/08/14-59 yoM never smoker followed for OSA, complicated by obesity, HBP, allergic rhinitis CPAP 20/ [facility] portable Follows [id number] using CPAP machine 5-7 hours night. Sent over download for CPAP. Will need flu vaccine today. Very pleased with his cough fullface mask and with his travel CPAP machine set at 20. He is getting  everything he needs on line. He says he faxed download a month ago. I don't find it yet. Reports sleeping very well and not told he is snoring. No daytime sleepiness. [place] internationally with it.  ROS-see HPI Constitutional:   No-   weight loss, night sweats, fevers, chills, fatigue, lassitude. HEENT:   No-  headaches, difficulty swallowing, tooth/dental problems, sore throat,       No-  sneezing, itching, ear ache, nasal congestion, post nasal drip,  CV:  No-   chest pain, orthopnea, PND, swelling in lower extremities, anasarca,                                                     dizziness, palpitations Resp: No-   shortness of breath with exertion or at rest.              No-   productive cough,  No non-productive cough,  No- coughing up of blood.              No-   change in color of mucus.  No- wheezing.   Skin: No-   rash or lesions. GI:  No-   heartburn, indigestion, abdominal pain, nausea, vomiting,  GU:  MS:  No-   joint pain or swelling.   Neuro-     nothing unusual Psych:  No- change in mood or affect. No depression or anxiety.  No memory loss.  OBJ- Physical Exam General-  Alert, Oriented, Affect-appropriate, Distress- none acute, +overweight Skin- rash-none, lesions- none, excoriation- none Lymphadenopathy- none Head- atraumatic            Eyes- Gross vision intact, PERRLA, conjunctivae and secretions clear            Ears- Hearing, canals-normal            Nose- Clear, no-Septal dev, mucus, polyps, erosion, perforation             Throat- Mallampati III , mucosa clear , drainage- none, tonsils- atrophic Neck- flexible , trachea midline, no stridor , thyroid nl, carotid no bruit Chest - symmetrical excursion , unlabored           Heart/CV- RRR , no murmur , no gallop  , no rub, nl s1 s2                           - JVD- none , edema- none, stasis changes- none, varices- none           Lung- clear to P&A, wheeze- none, cough- none , dullness-none, rub- none            Chest wall-  Abd-  Br/ Gen/ Rectal- Not done, not indicated Extrem- cyanosis- none, clubbing, none, atrophy- none, strength- nl Neuro- grossly intact to observation

## 2014-12-08 NOTE — Assessment & Plan Note (Signed)
He remains overweight and is advised that sleep apnea control will be substantially improved if he can achieve normal PMI range.

## 2014-12-08 NOTE — Assessment & Plan Note (Signed)
He describes good compliance and control with definite improvement in quality of life using CPAP.

## 2014-12-08 NOTE — Patient Instructions (Signed)
We can continue CPAP 20/ [facility]   Using your [facility] travel miniCPAP  We may call you to request another copy of your download if we can't find the last one you sent.   Flu vax

## 2015-02-14 ENCOUNTER — Other Ambulatory Visit: Payer: Self-pay

## 2015-02-14 MED ORDER — MOMETASONE FUROATE 50 MCG/ACT NA SUSP: 2.0000 | Freq: Every day | NASAL | Status: DC

## 2015-02-14 NOTE — Telephone Encounter (Signed)
Rx requet for mometasone 50 mcg nasal spray- insert 2 sprays into the nose at bedtime #17.   Pharmacy [facility].   Rx sent to pharmacy.  Pt will have physical exam April  2017.

## 2015-05-14 ENCOUNTER — Other Ambulatory Visit (INDEPENDENT_AMBULATORY_CARE_PROVIDER_SITE_OTHER): Payer: BLUE CROSS/BLUE SHIELD

## 2015-05-14 DIAGNOSIS — Z0000 Encounter for general adult medical examination without abnormal findings: Secondary | ICD-10-CM

## 2015-05-14 LAB — POC URINALSYSI DIPSTICK (AUTOMATED)
Bilirubin, UA: NEGATIVE
Blood, UA: NEGATIVE
Glucose, UA: NEGATIVE
KETONES UA: NEGATIVE
LEUKOCYTES UA: NEGATIVE
Nitrite, UA: NEGATIVE
PH UA: 7
PROTEIN UA: NEGATIVE
Spec Grav, UA: 1.02
Urobilinogen, UA: 0.2

## 2015-05-14 LAB — CBC WITH DIFFERENTIAL/PLATELET
Basophils Absolute: 0 10*3/uL (ref 0.0–0.1)
Basophils Relative: 0.7 % (ref 0.0–3.0)
EOS PCT: 6.5 % — AB (ref 0.0–5.0)
Eosinophils Absolute: 0.4 10*3/uL (ref 0.0–0.7)
HEMATOCRIT: 39.7 % (ref 39.0–52.0)
HEMOGLOBIN: 13.4 g/dL (ref 13.0–17.0)
LYMPHS PCT: 30.3 % (ref 12.0–46.0)
Lymphs Abs: 2 10*3/uL (ref 0.7–4.0)
MCHC: 33.7 g/dL (ref 30.0–36.0)
MCV: 89.2 fl (ref 78.0–100.0)
MONOS PCT: 7.7 % (ref 3.0–12.0)
Monocytes Absolute: 0.5 10*3/uL (ref 0.1–1.0)
Neutro Abs: 3.7 10*3/uL (ref 1.4–7.7)
Neutrophils Relative %: 54.8 % (ref 43.0–77.0)
Platelets: 233 10*3/uL (ref 150.0–400.0)
RBC: 4.45 Mil/uL (ref 4.22–5.81)
RDW: 13.7 % (ref 11.5–15.5)
WBC: 6.7 10*3/uL (ref 4.0–10.5)

## 2015-05-14 LAB — HEPATIC FUNCTION PANEL
ALBUMIN: 3.9 g/dL (ref 3.5–5.2)
ALT: 28 U/L (ref 0–53)
AST: 25 U/L (ref 0–37)
Alkaline Phosphatase: 45 U/L (ref 39–117)
BILIRUBIN TOTAL: 0.6 mg/dL (ref 0.2–1.2)
Bilirubin, Direct: 0.1 mg/dL (ref 0.0–0.3)
TOTAL PROTEIN: 6.4 g/dL (ref 6.0–8.3)

## 2015-05-14 LAB — LIPID PANEL
CHOLESTEROL: 155 mg/dL (ref 0–200)
HDL: 44.8 mg/dL (ref >39.00)
LDL CALC: 96 mg/dL (ref 0–99)
NonHDL: 110.19
Total CHOL/HDL Ratio: 3
Triglycerides: 71 mg/dL (ref 0.0–149.0)
VLDL: 14.2 mg/dL (ref 0.0–40.0)

## 2015-05-14 LAB — PSA: PSA: 1.47 ng/mL (ref 0.10–4.00)

## 2015-05-14 LAB — BASIC METABOLIC PANEL
BUN: 21 mg/dL (ref 6–23)
CHLORIDE: 106 meq/L (ref 96–112)
CO2: 27 mEq/L (ref 19–32)
Calcium: 9 mg/dL (ref 8.4–10.5)
Creatinine, Ser: 0.98 mg/dL (ref 0.40–1.50)
GFR: 83.54 mL/min (ref >60.00)
Glucose, Bld: 107 mg/dL — ABNORMAL HIGH (ref 70–99)
POTASSIUM: 4 meq/L (ref 3.5–5.1)
SODIUM: 140 meq/L (ref 135–145)

## 2015-05-14 LAB — TSH: TSH: 2.37 u[IU]/mL (ref 0.35–4.50)

## 2015-05-17 ENCOUNTER — Other Ambulatory Visit: Payer: Self-pay | Admitting: Family Medicine

## 2015-05-17 DIAGNOSIS — G4733 Obstructive sleep apnea (adult) (pediatric): Secondary | ICD-10-CM | POA: Diagnosis not present

## 2015-05-18 ENCOUNTER — Ambulatory Visit (INDEPENDENT_AMBULATORY_CARE_PROVIDER_SITE_OTHER): Payer: BLUE CROSS/BLUE SHIELD | Admitting: Family Medicine

## 2015-05-18 ENCOUNTER — Encounter: Payer: Self-pay | Admitting: Family Medicine

## 2015-05-18 VITALS — BP 124/76 | HR 75 | Temp 98.9°F | Ht 71.0 in | Wt 273.0 lb

## 2015-05-18 DIAGNOSIS — H04123 Dry eye syndrome of bilateral lacrimal glands: Secondary | ICD-10-CM | POA: Diagnosis not present

## 2015-05-18 DIAGNOSIS — H1852 Epithelial (juvenile) corneal dystrophy: Secondary | ICD-10-CM | POA: Diagnosis not present

## 2015-05-18 DIAGNOSIS — R6889 Other general symptoms and signs: Secondary | ICD-10-CM

## 2015-05-18 DIAGNOSIS — Z0001 Encounter for general adult medical examination with abnormal findings: Secondary | ICD-10-CM | POA: Diagnosis not present

## 2015-05-18 DIAGNOSIS — D485 Neoplasm of uncertain behavior of skin: Secondary | ICD-10-CM

## 2015-05-18 DIAGNOSIS — H16223 Keratoconjunctivitis sicca, not specified as Sjogren's, bilateral: Secondary | ICD-10-CM | POA: Diagnosis not present

## 2015-05-18 DIAGNOSIS — H524 Presbyopia: Secondary | ICD-10-CM | POA: Diagnosis not present

## 2015-05-18 NOTE — Patient Instructions (Signed)
We will call you within a week about your referral to dermatology for spot in left hairline. If you do not hear within 2 weeks, give [place] a call.   Great job making some positive changes. i do think you will start to see the lbs come off but even if not- you working so hard is what is keeping your #s so good- be encouraged  If you are not able to exercise due to knee- let's consider injection either with me or Dr. Tamala Julian.

## 2015-05-18 NOTE — Progress Notes (Addendum)
Phone: [phone]  Subjective:  Patient presents today for their annual physical. Chief complaint-noted.   See problem oriented charting- ROS- full  review of systems was completed and negative including No chest pain or shortness of breath. No headache or blurry vision.   The following were reviewed and entered/updated in epic: Past Medical History  Diagnosis Date  . Allergy     seasonal  . Arthritis   . GERD (gastroesophageal reflux disease)   . Hyperlipidemia   . Hypertension   . Sleep apnea     c pap   Patient Active Problem List   Diagnosis Date Noted  . Hyperglycemia 05/12/2014    Priority: Medium  . Obstructive sleep apnea 08/05/2013    Priority: Medium  . Obesity 03/02/2009    Priority: Medium  . Hyperlipidemia 02/23/2007    Priority: Medium  . Essential hypertension 02/23/2007    Priority: Medium  . Bursitis of left shoulder 07/21/2014    Priority: Low  . Anterior thigh numbness 07/21/2014    Priority: Low  . Tear of MCL (medial collateral ligament) of knee 06/09/2014    Priority: Low  . Personal history of adenomatous colonic polyps 01/07/2012    Priority: Low  . Allergic rhinitis 02/23/2007    Priority: Low  . GERD 02/23/2007    Priority: Low  . Acute medial meniscal tear 06/09/2014   Past Surgical History  Procedure Laterality Date  . Back surgery  2015  . Other  1971    tonsilectomy    Family History  Problem Relation Age of Onset  . Lymphoma Mother     stage 3  . Cancer Mother     eventually leukemia  . Heart disease Father 38    first event and led to death  . Colon cancer Maternal Uncle   . Heart disease Paternal Uncle   . Diabetes Mother   . Cancer Sister     blood/spleen related    Medications- reviewed and updated Current Outpatient Prescriptions  Medication Sig Dispense Refill  . amLODipine-valsartan (EXFORGE) 5-160 MG tablet TAKE 1 TABLET BY MOUTH EVERY DAY 30 tablet 5  . aspirin 81 MG tablet Take 81 mg by mouth daily.       Marland Kitchen atorvastatin (LIPITOR) 20 MG tablet TAKE 1 TABLET BY MOUTH EVERY DAY 6 PM 30 tablet 6  . desloratadine (CLARINEX) 5 MG tablet TAKE 1 TABLET BY MOUTH DAILY 30 tablet 5  . esomeprazole (NEXIUM) 40 MG capsule TAKE 1 CAPSULE BY MOUTH EVERY MORNING BEFORE BREAKFAST 30 capsule 5  . meloxicam (MOBIC) 15 MG tablet TAKE 1 TABLET(15 MG) BY MOUTH DAILY 30 tablet 0  . Misc Natural Products (OSTEO BI-FLEX ADV JOINT SHIELD) TABS Take 1 tablet by mouth daily.    . mometasone (NASONEX) 50 MCG/ACT nasal spray Place 2 sprays into the nose at bedtime. 17 g 5  . Multiple Vitamins-Minerals (MULTIVITAMIN WITH MINERALS) tablet Take 1 tablet by mouth daily.     Allergies-reviewed and updated Allergies  Allergen Reactions  . Shellfish Allergy Other (See Comments)    Sore throat    Social History   Social History  . Marital Status: Married    Spouse Name: N/A  . Number of Children: N/A  . Years of Education: N/A   Social History Main Topics  . Smoking status: Never Smoker   . Smokeless tobacco: Never Used  . Alcohol Use: 0.0 oz/week     Comment: rarely  . Drug Use: No  . Sexual Activity:  Yes   Other Topics Concern  . None   Social History Narrative   Married 1990. Wife with bile duct cancer misdiagnosed in 2008, recurred 2016, < 1 year life expectancy. 1 daughter.       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       Hobbies: golf, sports, tv             ROS--See HPI   Objective: BP 124/76 mmHg  Pulse 75  Temp(Src) 98.9 F (37.2 C)  Ht 5\' 11"  (1.803 m)  Wt 273 lb (123.832 kg)  BMI 38.09 kg/m2 Gen: NAD, resting comfortably HEENT: Mucous membranes are moist. Oropharynx normal Neck: no thyromegaly CV: RRR no murmurs rubs or gallops Lungs: CTAB no crackles, wheeze, rhonchi Abdomen: soft/nontender/nondistended/normal bowel sounds. No rebound or guarding.  Rectal: normal tone, normal size prostate, no masses or tenderness Ext: no edema Skin: warm, dry, lesion uncertain  behavior on forehead noted Neuro: grossly normal, moves all extremities, PERRLA  Assessment/Plan:  60 y.o. male presenting for annual physical.  Health Maintenance counseling: 1. Anticipatory guidance: Patient counseled regarding regular dental exams, eye exams, wearing seatbelts.  2. Risk factor reduction:  Advised patient of need for regular exercise and diet rich and fruits and vegetables to reduce risk of heart attack and stroke. Exercising most days of week. Eats almost paleo type diet. Has been to nutritonist.  Wt Readings from Last 3 Encounters:  05/18/15 273 lb (123.832 kg)  12/08/14 275 lb 9.6 oz (125.011 kg)  07/21/14 268 lb (121.564 kg)  3. Immunizations/screenings/ancillary studies Immunization History  Administered Date(s) Administered  . Influenza Split 12/14/2011, 03/13/2013  . Influenza Whole 12/11/2009  . Influenza,inj,Quad PF,36+ Mos 12/02/2013, 12/08/2014  . Td 02/10/2006   Health Maintenance Due  Topic Date Due  . Hepatitis C Screening - next years labs 1955-03-29  . HIV Screening - next years labs 07/05/1972   4. Prostate cancer screening- low risk based off psa and rectal   Lab Results  Component Value Date   PSA 1.47 05/14/2015   PSA 1.57 05/09/2014   PSA 1.33 08/18/2008   5. Colon cancer screening - 01/07/12 with 5 year repeat with adenoma history 6. Skin cancer screening- refer to dermatology  Obesity- working very hard on weight los  Wt Readings from Last 3 Encounters:  05/18/15 273 lb (123.832 kg)  12/08/14 275 lb 9.6 oz (125.011 kg)  07/21/14 268 lb (121.564 kg)  Hyperlipidemia- great control on atorvastatin 20mg .  Hyperglycemia- remains in 100-110 range, monitor only Hypertension- controlled on exforge  BP Readings from Last 3 Encounters:  05/18/15 124/76  12/08/14 122/72  07/21/14 122/84  Right knee pain- lots of weight bearing makes it worse. History of meniscal issue. Using mobic as needed.   Neoplasm of uncertain behavior of skin -  Plan: Ambulatory referral to Dermatology On forehead   Return in about 6 months (around 11/17/2015) for follow up- or sooner if needed. Return precautions advised.   Orders Placed This Encounter  Procedures  . Ambulatory referral to Dermatology    Referral Priority:  Routine    Referral Type:  Consultation    Referral Reason:  Specialty Services Required    Requested Specialty:  Dermatology    Number of Visits Requested:  1   Garret Reddish, MD

## 2015-05-19 ENCOUNTER — Other Ambulatory Visit: Payer: Self-pay | Admitting: Family Medicine

## 2015-05-29 DIAGNOSIS — L820 Inflamed seborrheic keratosis: Secondary | ICD-10-CM | POA: Diagnosis not present

## 2015-05-29 DIAGNOSIS — D485 Neoplasm of uncertain behavior of skin: Secondary | ICD-10-CM | POA: Diagnosis not present

## 2015-06-01 DIAGNOSIS — H25013 Cortical age-related cataract, bilateral: Secondary | ICD-10-CM | POA: Diagnosis not present

## 2015-06-01 DIAGNOSIS — H524 Presbyopia: Secondary | ICD-10-CM | POA: Diagnosis not present

## 2015-06-01 DIAGNOSIS — H16223 Keratoconjunctivitis sicca, not specified as Sjogren's, bilateral: Secondary | ICD-10-CM | POA: Diagnosis not present

## 2015-06-01 DIAGNOSIS — H2513 Age-related nuclear cataract, bilateral: Secondary | ICD-10-CM | POA: Diagnosis not present

## 2015-06-01 DIAGNOSIS — H04123 Dry eye syndrome of bilateral lacrimal glands: Secondary | ICD-10-CM | POA: Diagnosis not present

## 2015-06-15 DIAGNOSIS — H04123 Dry eye syndrome of bilateral lacrimal glands: Secondary | ICD-10-CM | POA: Diagnosis not present

## 2015-06-15 DIAGNOSIS — H16223 Keratoconjunctivitis sicca, not specified as Sjogren's, bilateral: Secondary | ICD-10-CM | POA: Diagnosis not present

## 2015-06-19 ENCOUNTER — Other Ambulatory Visit: Payer: Self-pay | Admitting: Family Medicine

## 2015-07-09 ENCOUNTER — Other Ambulatory Visit: Payer: Self-pay | Admitting: Family Medicine

## 2015-07-12 ENCOUNTER — Other Ambulatory Visit: Payer: Self-pay | Admitting: General Practice

## 2015-07-12 MED ORDER — ATORVASTATIN CALCIUM 20 MG PO TABS: ORAL_TABLET | ORAL | Status: DC

## 2015-08-17 DIAGNOSIS — G4733 Obstructive sleep apnea (adult) (pediatric): Secondary | ICD-10-CM | POA: Diagnosis not present

## 2015-09-14 DIAGNOSIS — D235 Other benign neoplasm of skin of trunk: Secondary | ICD-10-CM | POA: Diagnosis not present

## 2015-09-14 DIAGNOSIS — H16223 Keratoconjunctivitis sicca, not specified as Sjogren's, bilateral: Secondary | ICD-10-CM | POA: Diagnosis not present

## 2015-09-14 DIAGNOSIS — L820 Inflamed seborrheic keratosis: Secondary | ICD-10-CM | POA: Diagnosis not present

## 2015-09-14 DIAGNOSIS — L814 Other melanin hyperpigmentation: Secondary | ICD-10-CM | POA: Diagnosis not present

## 2015-09-14 DIAGNOSIS — L570 Actinic keratosis: Secondary | ICD-10-CM | POA: Diagnosis not present

## 2015-09-14 DIAGNOSIS — H04123 Dry eye syndrome of bilateral lacrimal glands: Secondary | ICD-10-CM | POA: Diagnosis not present

## 2015-09-14 DIAGNOSIS — B079 Viral wart, unspecified: Secondary | ICD-10-CM | POA: Diagnosis not present

## 2015-09-22 IMAGING — CR DG KNEE COMPLETE 4+V*R*
4 series · 4 of 4 positions shown · non-contrast
Comparison: None.

CLINICAL DATA: Right knee pain and swelling. Knee injury in
March 2014. Recent diagnosis of meniscal tear and MCL tear.

EXAM:
RIGHT KNEE - COMPLETE 4+ VIEW

[view not recorded (1 of 4)]
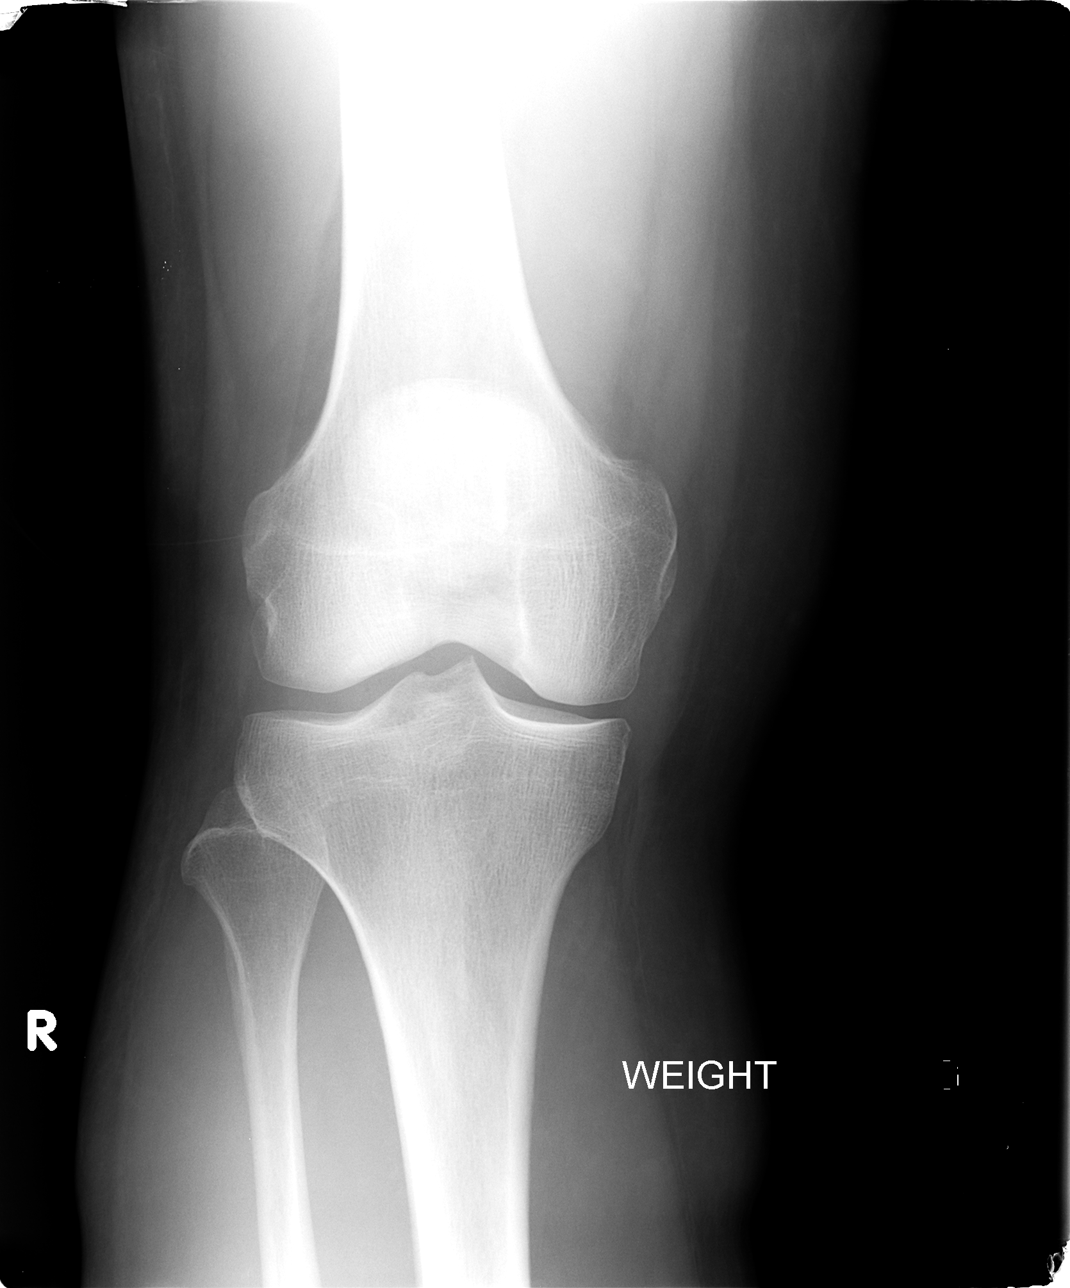

[view not recorded (2 of 4)]
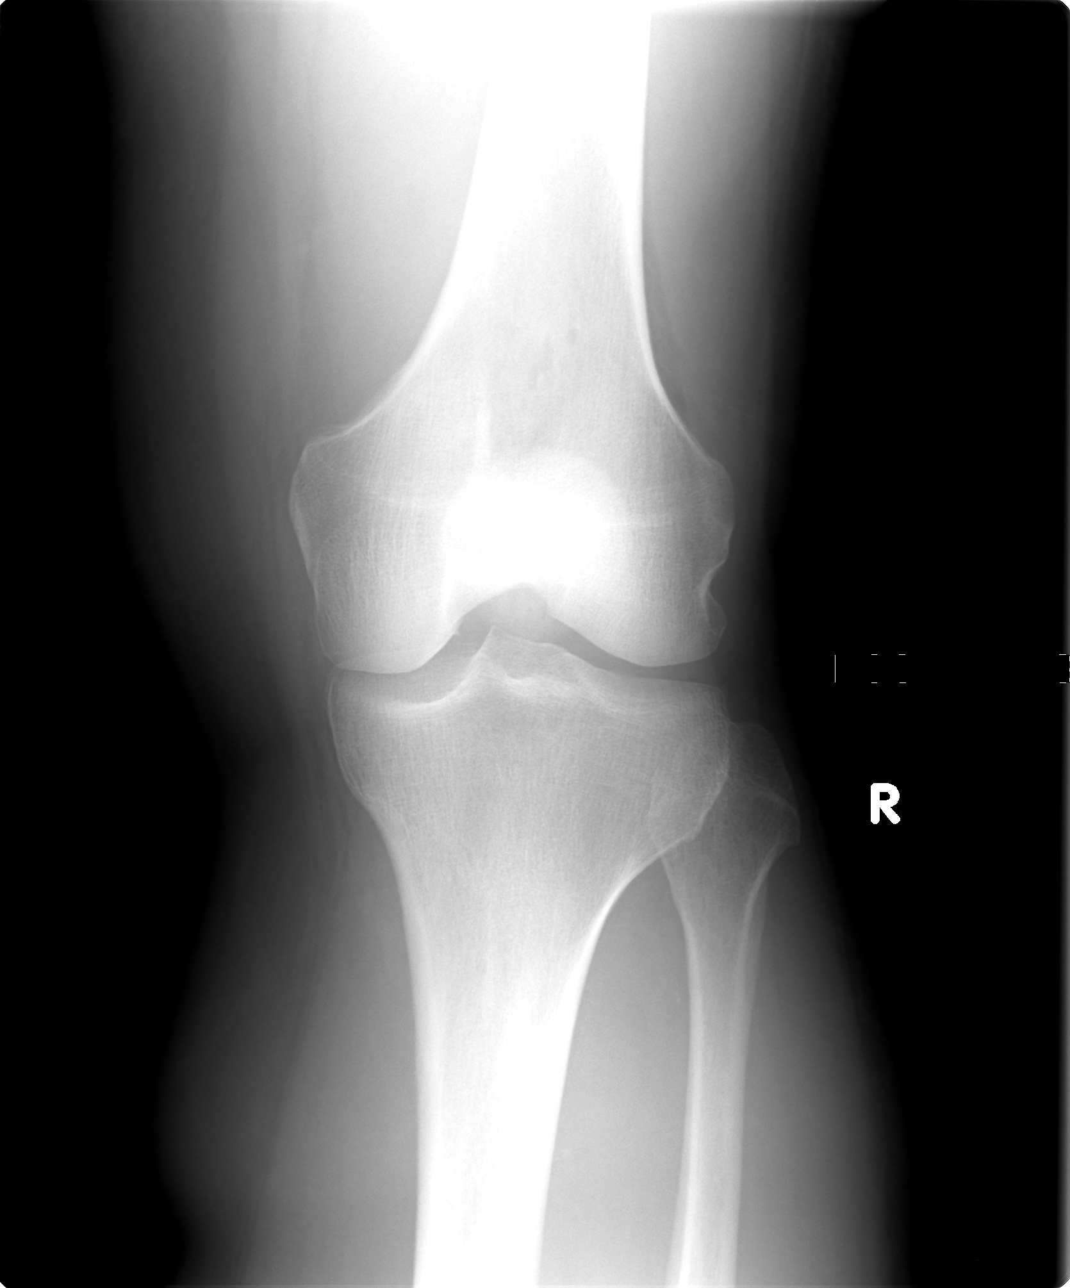

[view not recorded (3 of 4)]
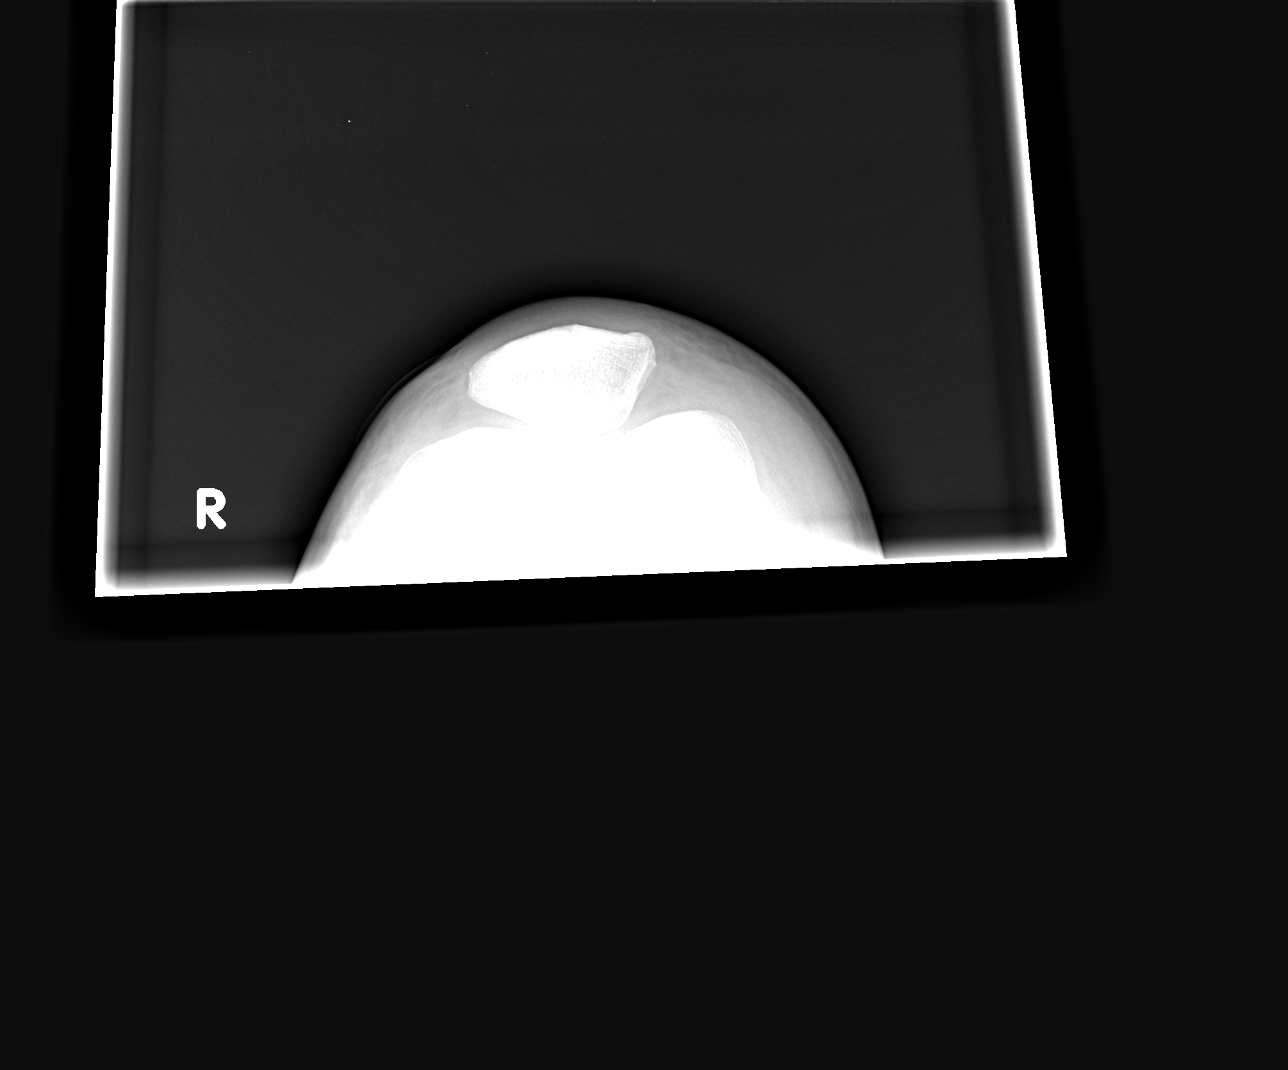

[view not recorded (4 of 4)]
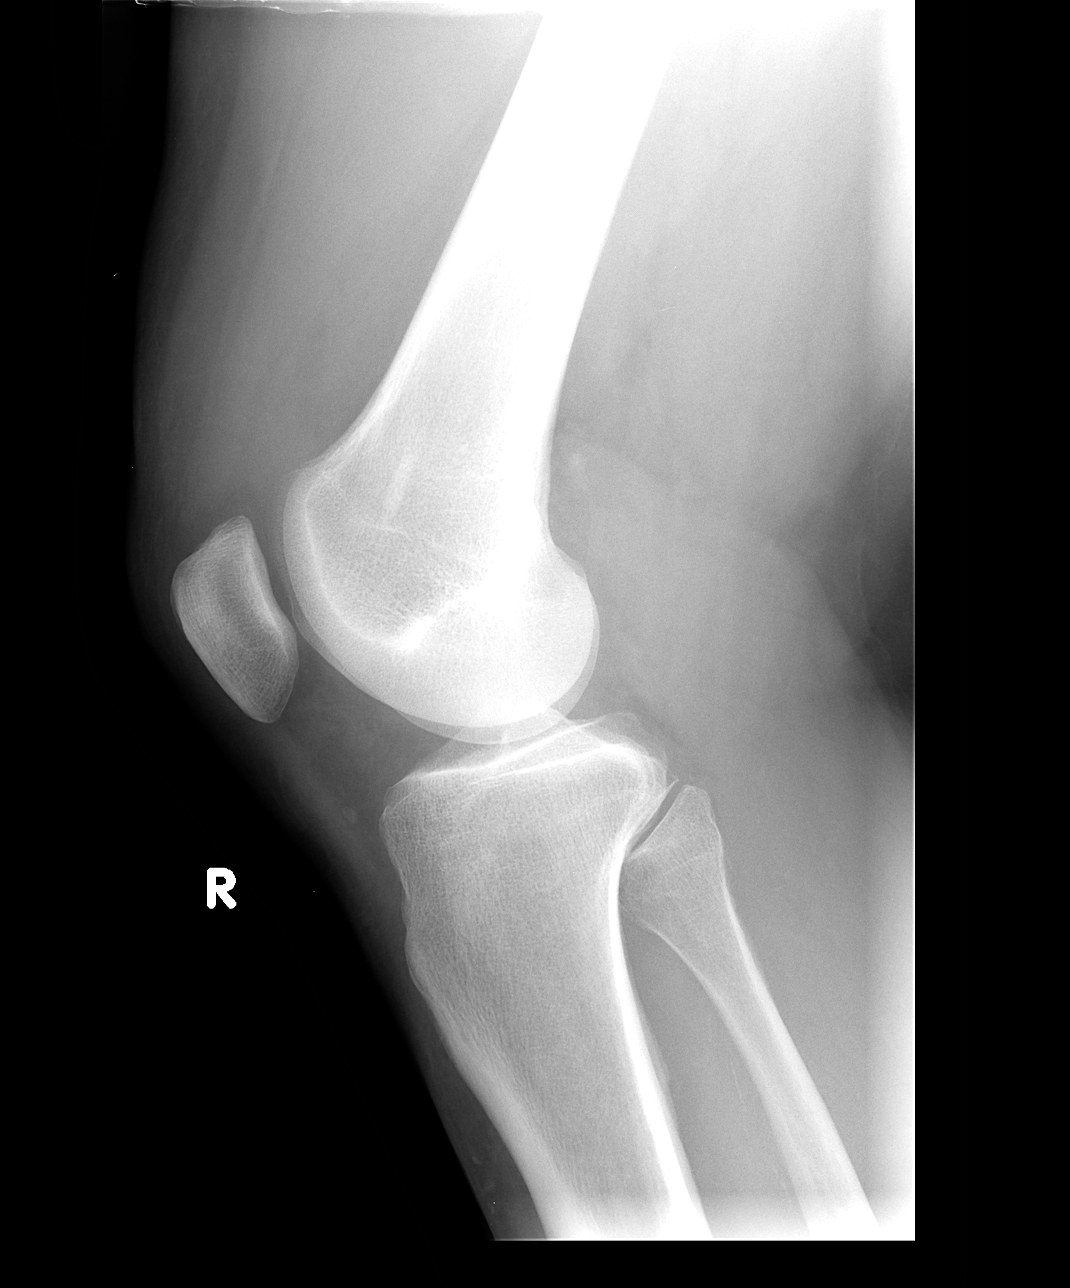

[4 of 4 positions shown; findings below may reference images not displayed]

FINDINGS: Mild articular space narrowing in the medial compartment on the
frontal weight-bearing view and on the Sisenov view.

Patellofemoral articular space is preserved.

There is at least a moderate effusion in the suprapatellar bursa.

No acute bony findings.
IMPRESSION: 1. Mild medial compartmental articular space narrowing compatible
with chondral thinning.
2. Moderate knee effusion.

## 2015-09-28 ENCOUNTER — Encounter: Payer: Self-pay | Admitting: Family Medicine

## 2015-10-04 ENCOUNTER — Other Ambulatory Visit: Payer: Self-pay

## 2015-10-04 DIAGNOSIS — M25561 Pain in right knee: Secondary | ICD-10-CM

## 2015-10-05 ENCOUNTER — Encounter: Payer: Self-pay | Admitting: Family Medicine

## 2015-10-05 DIAGNOSIS — D485 Neoplasm of uncertain behavior of skin: Secondary | ICD-10-CM | POA: Diagnosis not present

## 2015-10-05 DIAGNOSIS — B079 Viral wart, unspecified: Secondary | ICD-10-CM | POA: Diagnosis not present

## 2015-10-10 ENCOUNTER — Encounter: Payer: Self-pay | Admitting: Family Medicine

## 2015-10-10 ENCOUNTER — Other Ambulatory Visit: Payer: Self-pay

## 2015-10-10 DIAGNOSIS — M25569 Pain in unspecified knee: Secondary | ICD-10-CM

## 2015-10-11 DIAGNOSIS — S83241A Other tear of medial meniscus, current injury, right knee, initial encounter: Secondary | ICD-10-CM | POA: Diagnosis not present

## 2015-10-11 DIAGNOSIS — S5001XA Contusion of right elbow, initial encounter: Secondary | ICD-10-CM | POA: Diagnosis not present

## 2015-10-22 DIAGNOSIS — M25561 Pain in right knee: Secondary | ICD-10-CM | POA: Diagnosis not present

## 2015-10-23 DIAGNOSIS — S83241D Other tear of medial meniscus, current injury, right knee, subsequent encounter: Secondary | ICD-10-CM | POA: Diagnosis not present

## 2015-10-25 ENCOUNTER — Telehealth: Payer: Self-pay

## 2015-10-25 NOTE — Telephone Encounter (Signed)
Called patient and asked for a return phone call. Patient needs to be seen for a preoperative clearance

## 2015-10-25 NOTE — Telephone Encounter (Signed)
Spoke with patient who was on a layover. He is flying to [place] and said he won't be back until right before he is scheduled for surgery. They have an opening on the 20th and he is hoping to have it done then as he has 2 tears in his meniscus.  He ask that you call him at the end of the day and he can skype you or have a telephone conversation.

## 2015-10-25 NOTE — Telephone Encounter (Signed)
Discussed with patient. He is able to complete 4 METS without difficulty and without chest pain or shortness of breath. Discussed would prefer to update EKG before surgery but given limited timeframe to complete surgery- also reasonable to proceed with prior controlled lipids and cholesterol

## 2015-10-31 DIAGNOSIS — S83281A Other tear of lateral meniscus, current injury, right knee, initial encounter: Secondary | ICD-10-CM | POA: Diagnosis not present

## 2015-10-31 DIAGNOSIS — G8918 Other acute postprocedural pain: Secondary | ICD-10-CM | POA: Diagnosis not present

## 2015-10-31 DIAGNOSIS — M94261 Chondromalacia, right knee: Secondary | ICD-10-CM | POA: Diagnosis not present

## 2015-10-31 DIAGNOSIS — M23251 Derangement of posterior horn of lateral meniscus due to old tear or injury, right knee: Secondary | ICD-10-CM | POA: Diagnosis not present

## 2015-10-31 DIAGNOSIS — S83241A Other tear of medial meniscus, current injury, right knee, initial encounter: Secondary | ICD-10-CM | POA: Diagnosis not present

## 2015-10-31 DIAGNOSIS — M23221 Derangement of posterior horn of medial meniscus due to old tear or injury, right knee: Secondary | ICD-10-CM | POA: Diagnosis not present

## 2015-11-05 ENCOUNTER — Encounter: Payer: Self-pay | Admitting: Family Medicine

## 2015-11-08 ENCOUNTER — Other Ambulatory Visit (HOSPITAL_COMMUNITY): Payer: Self-pay | Admitting: Orthopedic Surgery

## 2015-11-08 DIAGNOSIS — M7989 Other specified soft tissue disorders: Secondary | ICD-10-CM

## 2015-11-08 DIAGNOSIS — S83281D Other tear of lateral meniscus, current injury, right knee, subsequent encounter: Secondary | ICD-10-CM | POA: Diagnosis not present

## 2015-11-08 DIAGNOSIS — M79661 Pain in right lower leg: Secondary | ICD-10-CM

## 2015-11-08 DIAGNOSIS — S83241D Other tear of medial meniscus, current injury, right knee, subsequent encounter: Secondary | ICD-10-CM | POA: Diagnosis not present

## 2015-11-09 ENCOUNTER — Ambulatory Visit (HOSPITAL_COMMUNITY)
Admission: RE | Admit: 2015-11-09 | Discharge: 2015-11-09 | Disposition: A | Discharge status: Home / Self Care | Payer: BLUE CROSS/BLUE SHIELD | Source: Ambulatory Visit | Attending: Family Medicine | Admitting: Family Medicine

## 2015-11-09 DIAGNOSIS — M79661 Pain in right lower leg: Secondary | ICD-10-CM | POA: Diagnosis not present

## 2015-11-09 DIAGNOSIS — M79604 Pain in right leg: Secondary | ICD-10-CM

## 2015-11-09 DIAGNOSIS — M7989 Other specified soft tissue disorders: Secondary | ICD-10-CM | POA: Diagnosis not present

## 2015-11-09 NOTE — Progress Notes (Signed)
VASCULAR LAB PRELIMINARY  PRELIMINARY  PRELIMINARY  PRELIMINARY  Right lower extremity venous duplex completed.    Preliminary report:  Right:  No evidence of DVT, superficial thrombosis, or Baker's cyst.  Jessilyn Catino, RVS 11/09/2015, 2:10 PM

## 2015-11-13 DIAGNOSIS — S83249D Other tear of medial meniscus, current injury, unspecified knee, subsequent encounter: Secondary | ICD-10-CM | POA: Diagnosis not present

## 2015-11-13 DIAGNOSIS — R262 Difficulty in walking, not elsewhere classified: Secondary | ICD-10-CM | POA: Diagnosis not present

## 2015-11-13 DIAGNOSIS — R531 Weakness: Secondary | ICD-10-CM | POA: Diagnosis not present

## 2015-11-13 DIAGNOSIS — M25561 Pain in right knee: Secondary | ICD-10-CM | POA: Diagnosis not present

## 2015-11-14 ENCOUNTER — Ambulatory Visit (INDEPENDENT_AMBULATORY_CARE_PROVIDER_SITE_OTHER): Payer: BLUE CROSS/BLUE SHIELD | Admitting: Sports Medicine

## 2015-11-14 ENCOUNTER — Encounter: Payer: Self-pay | Admitting: Sports Medicine

## 2015-11-14 VITALS — BP 123/80 | HR 70 | Ht 72.0 in | Wt 250.0 lb

## 2015-11-14 DIAGNOSIS — M216X1 Other acquired deformities of right foot: Secondary | ICD-10-CM

## 2015-11-14 DIAGNOSIS — M216X2 Other acquired deformities of left foot: Secondary | ICD-10-CM

## 2015-11-15 NOTE — Progress Notes (Signed)
   Subjective:    Patient ID: Billy Todd, male    DOB: August 20, 1955, 60 y.o.   MRN: [id number]  HPI chief complaint: "I'm here for orthotics"  Very pleasant 60 year old male comes in today at the request of Dr. Noemi Chapel for custom orthotics. He has had custom orthotics in the past but they are quite old. He is status post right knee arthroscopy done by Dr. Noemi Chapel on 10/31/2015 for partial medial and lateral meniscectomies. He is doing well postoperatively. He is asking about the possibility of orthotics for his tennis shoes as well as his dress shoes.  Past medical history reviewed Medications reviewed Allergies reviewed    Review of Systems  As above    Objective:   Physical Exam  Well-developed, well-nourished. No acute distress. Vital signs reviewed  Right knee: Good range of motion. No effusion. Surgical scars consistent with his previous arthroscopy.  Examination of his feet in the standing position shows a fairly well-preserved longitudinal arch but he has significant pronation with walking. Walking without a limp.       Assessment & Plan:   Status post right knee arthroscopy for partial medial and lateral meniscectomies done by Dr. Noemi Chapel on 10/31/2015 Pronation  Custom orthotics were created today for his tennis shoes. His tennis shoes are quite worn and I've recommended that he buy new pair. He found the orthotics comfortable prior to leaving the office. He would like to return to the office early next week for [place] to construct a pair of orthotics for his dress shoes. I've asked him to bring in 2 or 3 different pairs of dress shoes so that we can try to find an accommodating orthotic. He will follow-up with Dr. Noemi Chapel as scheduled.  Total time spent with the patient was 30 minutes with greater than 50% of the time spent in face-to-face consultation discussing orthotic construction, instruction, and fitting.  Patient was fitted for a : standard, cushioned, semi-rigid  orthotic. The orthotic was heated and afterward the patient stood on the orthotic blank positioned on the orthotic stand. The patient was positioned in subtalar neutral position and 10 degrees of ankle dorsiflexion in a weight bearing stance. After completion of molding, a stable base was applied to the orthotic blank. The blank was ground to a stable position for weight bearing. Size: 11 Base: Blue EVA Posting: none Additional orthotic padding: none

## 2015-11-20 ENCOUNTER — Ambulatory Visit (INDEPENDENT_AMBULATORY_CARE_PROVIDER_SITE_OTHER): Payer: BLUE CROSS/BLUE SHIELD | Admitting: Sports Medicine

## 2015-11-20 ENCOUNTER — Encounter: Payer: Self-pay | Admitting: Sports Medicine

## 2015-11-20 VITALS — BP 106/68 | HR 61 | Ht 71.0 in | Wt 250.0 lb

## 2015-11-20 DIAGNOSIS — M216X1 Other acquired deformities of right foot: Secondary | ICD-10-CM | POA: Diagnosis not present

## 2015-11-20 DIAGNOSIS — R262 Difficulty in walking, not elsewhere classified: Secondary | ICD-10-CM | POA: Diagnosis not present

## 2015-11-20 DIAGNOSIS — M25561 Pain in right knee: Secondary | ICD-10-CM | POA: Diagnosis not present

## 2015-11-20 DIAGNOSIS — M216X2 Other acquired deformities of left foot: Secondary | ICD-10-CM | POA: Diagnosis not present

## 2015-11-20 DIAGNOSIS — S83249D Other tear of medial meniscus, current injury, unspecified knee, subsequent encounter: Secondary | ICD-10-CM | POA: Diagnosis not present

## 2015-11-20 DIAGNOSIS — R531 Weakness: Secondary | ICD-10-CM | POA: Diagnosis not present

## 2015-11-20 NOTE — Progress Notes (Signed)
  Patient comes in today to have a pair of orthotics constructed for his dress shoes. He was seen in the office last week and we constructed a pair of orthotics for his tennis shoes. However, he has found that those orthotics are too tight in his current tennis shoes and is planning on returning them to the store and exchanging them for another pair. I explained to him that if this pair of shoes was also too tight then we could grind down the orthotic to make it thinner.  Today we constructed him a pair of orthotics for his dress shoes. We were eventually able to get a comfortable fit. He will try these and if he would like a second pair of dress orthotics for other shoes then we can consider making those at a later date. I also explained to him that I can aggressively grind down the blue EVA or replacing it with blue poron.  Patient found the orthotics to be comfortable prior to leaving. Good correction of his pronation was noted. Follow-up as needed..  Total time spent with the patient was 30 minutes with greater than 50% of the time spent in face-to-face consultation discussing orthotic construction, instruction, and fitting.  Patient was fitted for a : standard, cushioned, semi-rigid orthotic. The orthotic was heated and afterward the patient stood on the orthotic blank positioned on the orthotic stand. The patient was positioned in subtalar neutral position and 10 degrees of ankle dorsiflexion in a weight bearing stance. After completion of molding, a stable base was applied to the orthotic blank. The blank was ground to a stable position for weight bearing. Size: 10 Fastech ("dress shoe orthotics") Base: Blue EVA Posting: none Additional orthotic padding: none

## 2015-11-25 ENCOUNTER — Encounter: Payer: Self-pay | Admitting: Family Medicine

## 2015-11-27 ENCOUNTER — Other Ambulatory Visit: Payer: Self-pay | Admitting: Family Medicine

## 2015-11-27 DIAGNOSIS — M25561 Pain in right knee: Secondary | ICD-10-CM | POA: Diagnosis not present

## 2015-11-27 DIAGNOSIS — R531 Weakness: Secondary | ICD-10-CM | POA: Diagnosis not present

## 2015-11-27 DIAGNOSIS — R262 Difficulty in walking, not elsewhere classified: Secondary | ICD-10-CM | POA: Diagnosis not present

## 2015-11-27 DIAGNOSIS — S83249D Other tear of medial meniscus, current injury, unspecified knee, subsequent encounter: Secondary | ICD-10-CM | POA: Diagnosis not present

## 2015-11-29 DIAGNOSIS — S83242D Other tear of medial meniscus, current injury, left knee, subsequent encounter: Secondary | ICD-10-CM | POA: Diagnosis not present

## 2015-11-29 DIAGNOSIS — M25552 Pain in left hip: Secondary | ICD-10-CM | POA: Diagnosis not present

## 2015-11-29 DIAGNOSIS — S83249D Other tear of medial meniscus, current injury, unspecified knee, subsequent encounter: Secondary | ICD-10-CM | POA: Diagnosis not present

## 2015-11-29 DIAGNOSIS — M25561 Pain in right knee: Secondary | ICD-10-CM | POA: Diagnosis not present

## 2015-11-29 DIAGNOSIS — M5416 Radiculopathy, lumbar region: Secondary | ICD-10-CM | POA: Diagnosis not present

## 2015-11-29 DIAGNOSIS — R531 Weakness: Secondary | ICD-10-CM | POA: Diagnosis not present

## 2015-11-29 DIAGNOSIS — R262 Difficulty in walking, not elsewhere classified: Secondary | ICD-10-CM | POA: Diagnosis not present

## 2015-12-14 ENCOUNTER — Encounter: Payer: Self-pay | Admitting: Internal Medicine

## 2015-12-14 ENCOUNTER — Ambulatory Visit (INDEPENDENT_AMBULATORY_CARE_PROVIDER_SITE_OTHER): Payer: BLUE CROSS/BLUE SHIELD | Admitting: Internal Medicine

## 2015-12-14 ENCOUNTER — Encounter: Payer: Self-pay | Admitting: Family Medicine

## 2015-12-14 ENCOUNTER — Ambulatory Visit (INDEPENDENT_AMBULATORY_CARE_PROVIDER_SITE_OTHER): Payer: BLUE CROSS/BLUE SHIELD | Admitting: Family Medicine

## 2015-12-14 ENCOUNTER — Other Ambulatory Visit: Payer: Self-pay | Admitting: Family Medicine

## 2015-12-14 VITALS — BP 124/72 | HR 74 | Ht 72.0 in | Wt 249.0 lb

## 2015-12-14 VITALS — BP 116/72 | HR 88 | Temp 98.5°F | Wt 248.2 lb

## 2015-12-14 DIAGNOSIS — Z23 Encounter for immunization: Secondary | ICD-10-CM

## 2015-12-14 DIAGNOSIS — G4733 Obstructive sleep apnea (adult) (pediatric): Secondary | ICD-10-CM

## 2015-12-14 DIAGNOSIS — E785 Hyperlipidemia, unspecified: Secondary | ICD-10-CM | POA: Diagnosis not present

## 2015-12-14 DIAGNOSIS — R739 Hyperglycemia, unspecified: Secondary | ICD-10-CM | POA: Diagnosis not present

## 2015-12-14 DIAGNOSIS — E6609 Other obesity due to excess calories: Secondary | ICD-10-CM

## 2015-12-14 DIAGNOSIS — Z114 Encounter for screening for human immunodeficiency virus [HIV]: Secondary | ICD-10-CM | POA: Diagnosis not present

## 2015-12-14 DIAGNOSIS — Z7289 Other problems related to lifestyle: Secondary | ICD-10-CM | POA: Diagnosis not present

## 2015-12-14 LAB — HEMOGLOBIN A1C: Hgb A1c MFr Bld: 5.5 % (ref 4.6–6.5)

## 2015-12-14 LAB — LDL CHOLESTEROL, DIRECT: Direct LDL: 113 mg/dL

## 2015-12-14 NOTE — Assessment & Plan Note (Signed)
Current download shows good compliance 87%/4 hours, good control AHI 0.8/hour set at 16. When traveling out of town he uses [employer] operational officer which doesn't register on his download card.

## 2015-12-14 NOTE — Assessment & Plan Note (Signed)
S:Fasting CBG 100-110 before weight loss Lab Results  Component Value Date   HGBA1C 5.9 (H) 12/21/2009  when weight was 264 - weight down 16 lbs from there now A/P: asks about CBG but not fasting- will get a1c to compare to prior- discussed may be similar/unchanged but likely would be higher without his weight loss efforts. woudlbe great if improved.

## 2015-12-14 NOTE — Assessment & Plan Note (Addendum)
S: has been well controlle don atorvastatin 20mg  with LDL of 96- that was before weight loss A/P: wants updated LDL- hopeful it has improved some. Do need to discuss links between diabetes risk and statins at next visit. Hopeful with sustained weight loss may be able to reduce statin as well as potentially BP meds

## 2015-12-14 NOTE — Patient Instructions (Signed)
FANTASTIC job with weight loss and healthier eating. I love your goal of another 25 lbs off.   Update labs today

## 2015-12-14 NOTE — Progress Notes (Signed)
Subjective:  Billy Todd is a 60 y.o. year old very pleasant male patient who presents for/with See problem oriented charting ROS- No chest pain or shortness of breath. No headache or blurry vision. Feels great- some continued knee pain after surgery. see any ROS included in HPI as well.   Past Medical History-  Patient Active Problem List   Diagnosis Date Noted  . Hyperglycemia 05/12/2014    Priority: Medium  . Obstructive sleep apnea 08/05/2013    Priority: Medium  . Obesity 03/02/2009    Priority: Medium  . Hyperlipidemia 02/23/2007    Priority: Medium  . Essential hypertension 02/23/2007    Priority: Medium  . Bursitis of left shoulder 07/21/2014    Priority: Low  . Anterior thigh numbness 07/21/2014    Priority: Low  . Tear of MCL (medial collateral ligament) of knee 06/09/2014    Priority: Low  . Personal history of adenomatous colonic polyps 01/07/2012    Priority: Low  . Allergic rhinitis 02/23/2007    Priority: Low  . GERD 02/23/2007    Priority: Low  . Acute medial meniscal tear 06/09/2014    Medications- reviewed and updated Current Outpatient Prescriptions  Medication Sig Dispense Refill  . amLODipine-valsartan (EXFORGE) 5-160 MG tablet TAKE 1 TABLET BY MOUTH EVERY DAY 30 tablet 1  . aspirin 81 MG tablet Take 81 mg by mouth daily.      Marland Kitchen atorvastatin (LIPITOR) 20 MG tablet TAKE 1 TABLET BY MOUTH EVERY DAY 6 PM 30 tablet 5  . desloratadine (CLARINEX) 5 MG tablet TAKE 1 TABLET BY MOUTH DAILY 30 tablet 1  . esomeprazole (NEXIUM) 40 MG capsule TAKE 1 CAPSULE BY MOUTH EVERY MORNING BEFORE BREAKFAST 30 capsule 5  . mometasone (NASONEX) 50 MCG/ACT nasal spray Place 2 sprays into the nose at bedtime. 17 g 5  . Multiple Vitamins-Minerals (MULTIVITAMIN WITH MINERALS) tablet Take 1 tablet by mouth daily.     No current facility-administered medications for this visit.     Objective: BP 116/72 (BP Location: Left Arm, Patient Position: Sitting, Cuff Size: Large)    Pulse 88   Temp 98.5 F (36.9 C) (Oral)   Wt 248 lb 3.2 oz (112.6 kg)   SpO2 94%   BMI 33.66 kg/m  Gen: NAD, resting comfortably CV: RRR no murmurs rubs or gallops Lungs: CTAB no crackles, wheeze, rhonchi Abdomen: obese but much improved Ext: no edema  Assessment/Plan:  Obesity Wt Readings from Last 3 Encounters:  12/14/15 248 lb 3.2 oz (112.6 kg)  12/14/15 249 lb (112.9 kg)  11/20/15 250 lb (113.4 kg)  Weight 05/18/15 273. Last October 275  Has now lost 27 lbs using Therisa Doyne- diet evolution. Goal another 25 lbs. Hasjust recently started Doing some swimming after knee surgery. Congratulated patient on efforts and counseled on continued efforts- update LDL and a1c but hopeful improved on both fronts  Hyperglycemia S:Fasting CBG 100-110 before weight loss Lab Results  Component Value Date   HGBA1C 5.9 (H) 12/21/2009  when weight was 264 - weight down 16 lbs from there now A/P: asks about CBG but not fasting- will get a1c to compare to prior- discussed may be similar/unchanged but likely would be higher without his weight loss efforts. woudlbe great if improved.    Hyperlipidemia S: has been well controlle don atorvastatin 20mg  with LDL of 96- that was before weight loss A/P: wants updated LDL- hopeful it has improved some. Do need to discuss links between diabetes risk and statins at next  visit. Hopeful with sustained weight loss may be able to reduce statin as well as potentially BP meds   Return in about 6 months (around 06/12/2016) for physical.  Orders Placed This Encounter  Procedures  . LDL cholesterol, direct    [place]  . Hepatitis C antibody, reflex    [facility]  . HIV antibody  . Hemoglobin A1c    [place]   Return precautions advised.  Garret Reddish, MD

## 2015-12-14 NOTE — Assessment & Plan Note (Signed)
Wt Readings from Last 3 Encounters:  12/14/15 248 lb 3.2 oz (112.6 kg)  12/14/15 249 lb (112.9 kg)  11/20/15 250 lb (113.4 kg)  Weight 05/18/15 273. Last October 275  Has now lost 27 lbs using Therisa Doyne- diet evolution. Goal another 25 lbs. Hasjust recently started Doing some swimming after knee surgery. Congratulated patient on efforts and counseled on continued efforts- update LDL and a1c but hopeful improved on both fronts

## 2015-12-14 NOTE — Patient Instructions (Signed)
Order- [facility]- please evaluate eligibility for replacement of old CPAP machine. If so, please change to auto 10-20. Otherwise, continue CPAP 16, mask of choice, humidifier, supplies, AirView    Dx OSA  Flu vax

## 2015-12-14 NOTE — Progress Notes (Signed)
M never smoker followed for OSA, complicated by obesity, HBP, allergic rhinitis  12/02/13- 58 yoM never smoker followed for OSA, complicated by obesity, HBP, allergic rhinitis FOLLOWS FOR: Patient using CPAP auto every night 6-8 hours no problems. NPSG 10/06/13- moderate OSA, AHI 24.7/ hr, loud snoring, weight 250 pounds Likes his new Transcend travel CPAP. He is using a home device measuring sleep stages. We discussed normal pattern.  12/08/14-58 yoM never smoker followed for OSA, complicated by obesity, HBP, allergic rhinitis CPAP 16/ Advanced Transcend portable Follows [id number] using CPAP machine 5-7 hours night. Sent over download for CPAP. Will need flu vaccine today. Very pleased with his  fullface mask and with his travel CPAP machine set at 20. He is getting everything he needs on line. He says he faxed download a month ago. I don't find it yet. Reports sleeping very well and not told he is snoring. No daytime sleepiness. Travels internationally with it.  12/14/2015-60 year old male never smoker followed for OSA, complicated by obesity, HBP, allergic rhinitis CPAP 20/Advanced or on-line-Transcend portable for travel FOLLOWS FOR: yearly OSA follow up, wears CPAP every night x6-8hrs.  denies any mask or pressure issues. Now uses a standard CPAP machine while at home and download indicates setting 16 with good compliance and control. This machine is getting older. He uses Transcend machine for travel. Otherwise breathing is good with no cardiopulmonary complications. He accepts flu shot.  ROS-see HPI Constitutional:   No-   weight loss, night sweats, fevers, chills, fatigue, lassitude. HEENT:   No-  headaches, difficulty swallowing, tooth/dental problems, sore throat,       No-  sneezing, itching, ear ache, nasal congestion, post nasal drip,  CV:  No-   chest pain, orthopnea, PND, swelling in lower extremities, anasarca,                                                     dizziness,  palpitations Resp: No-   shortness of breath with exertion or at rest.              No-   productive cough,  No non-productive cough,  No- coughing up of blood.              No-   change in color of mucus.  No- wheezing.   Skin: No-   rash or lesions. GI:  No-   heartburn, indigestion, abdominal pain, nausea, vomiting,  GU:  MS:  No-   joint pain or swelling.   Neuro-     nothing unusual Psych:  No- change in mood or affect. No depression or anxiety.  No memory loss.  OBJ- Physical Exam General- Alert, Oriented, Affect-appropriate, Distress- none acute, +overweight Skin- rash-none, lesions- none, excoriation- none Lymphadenopathy- none Head- atraumatic            Eyes- Gross vision intact, PERRLA, conjunctivae and secretions clear            Ears- Hearing, canals-normal            Nose- Clear, no-Septal dev, mucus, polyps, erosion, perforation             Throat- Mallampati III , mucosa clear , drainage- none, tonsils- atrophic Neck- flexible , trachea midline, no stridor , thyroid nl, carotid no bruit Chest - symmetrical excursion , unlabored  Heart/CV- RRR , no murmur , no gallop  , no rub, nl s1 s2                           - JVD- none , edema- none, stasis changes- none, varices- none           Lung- clear to P&A, wheeze- none, cough- none , dullness-none, rub- none           Chest wall-  Abd-  Br/ Gen/ Rectal- Not done, not indicated Extrem- cyanosis- none, clubbing, none, atrophy- none, strength- nl Neuro- grossly intact to observation

## 2015-12-14 NOTE — Progress Notes (Signed)
Pre visit review using our clinic review tool, if applicable. No additional management support is needed unless otherwise documented below in the visit note. 

## 2015-12-15 LAB — HIV ANTIBODY (ROUTINE TESTING W REFLEX): HIV: NONREACTIVE

## 2015-12-15 LAB — HEPATITIS C ANTIBODY: HCV AB: NEGATIVE

## 2015-12-25 ENCOUNTER — Other Ambulatory Visit: Payer: Self-pay | Admitting: Family Medicine

## 2015-12-27 ENCOUNTER — Encounter: Payer: Self-pay | Admitting: Internal Medicine

## 2015-12-27 DIAGNOSIS — S83249D Other tear of medial meniscus, current injury, unspecified knee, subsequent encounter: Secondary | ICD-10-CM | POA: Diagnosis not present

## 2016-01-23 ENCOUNTER — Other Ambulatory Visit: Payer: Self-pay | Admitting: Family Medicine

## 2016-01-26 DIAGNOSIS — I10 Essential (primary) hypertension: Secondary | ICD-10-CM | POA: Diagnosis not present

## 2016-01-26 DIAGNOSIS — G4733 Obstructive sleep apnea (adult) (pediatric): Secondary | ICD-10-CM | POA: Diagnosis not present

## 2016-01-26 DIAGNOSIS — E785 Hyperlipidemia, unspecified: Secondary | ICD-10-CM | POA: Diagnosis not present

## 2016-01-29 DIAGNOSIS — S83249D Other tear of medial meniscus, current injury, unspecified knee, subsequent encounter: Secondary | ICD-10-CM | POA: Diagnosis not present

## 2016-02-07 DIAGNOSIS — G4733 Obstructive sleep apnea (adult) (pediatric): Secondary | ICD-10-CM | POA: Diagnosis not present

## 2016-02-07 DIAGNOSIS — I10 Essential (primary) hypertension: Secondary | ICD-10-CM | POA: Diagnosis not present

## 2016-02-07 DIAGNOSIS — E785 Hyperlipidemia, unspecified: Secondary | ICD-10-CM | POA: Diagnosis not present

## 2016-02-23 ENCOUNTER — Other Ambulatory Visit: Payer: Self-pay | Admitting: Family Medicine

## 2016-05-01 DIAGNOSIS — G4733 Obstructive sleep apnea (adult) (pediatric): Secondary | ICD-10-CM | POA: Diagnosis not present

## 2016-05-01 DIAGNOSIS — E785 Hyperlipidemia, unspecified: Secondary | ICD-10-CM | POA: Diagnosis not present

## 2016-05-23 ENCOUNTER — Other Ambulatory Visit: Payer: Self-pay | Admitting: Family Medicine

## 2016-06-02 ENCOUNTER — Other Ambulatory Visit: Payer: Self-pay | Admitting: *Deleted

## 2016-06-02 DIAGNOSIS — I10 Essential (primary) hypertension: Secondary | ICD-10-CM

## 2016-06-02 DIAGNOSIS — E785 Hyperlipidemia, unspecified: Secondary | ICD-10-CM

## 2016-06-02 DIAGNOSIS — Z0000 Encounter for general adult medical examination without abnormal findings: Secondary | ICD-10-CM

## 2016-06-05 DIAGNOSIS — M7582 Other shoulder lesions, left shoulder: Secondary | ICD-10-CM | POA: Diagnosis not present

## 2016-06-05 DIAGNOSIS — M7051 Other bursitis of knee, right knee: Secondary | ICD-10-CM | POA: Diagnosis not present

## 2016-06-06 ENCOUNTER — Other Ambulatory Visit (INDEPENDENT_AMBULATORY_CARE_PROVIDER_SITE_OTHER): Payer: BLUE CROSS/BLUE SHIELD

## 2016-06-06 DIAGNOSIS — H04123 Dry eye syndrome of bilateral lacrimal glands: Secondary | ICD-10-CM | POA: Diagnosis not present

## 2016-06-06 DIAGNOSIS — H16223 Keratoconjunctivitis sicca, not specified as Sjogren's, bilateral: Secondary | ICD-10-CM | POA: Diagnosis not present

## 2016-06-06 DIAGNOSIS — I10 Essential (primary) hypertension: Secondary | ICD-10-CM | POA: Diagnosis not present

## 2016-06-06 DIAGNOSIS — H2513 Age-related nuclear cataract, bilateral: Secondary | ICD-10-CM | POA: Diagnosis not present

## 2016-06-06 DIAGNOSIS — H35362 Drusen (degenerative) of macula, left eye: Secondary | ICD-10-CM | POA: Diagnosis not present

## 2016-06-06 DIAGNOSIS — E785 Hyperlipidemia, unspecified: Secondary | ICD-10-CM | POA: Diagnosis not present

## 2016-06-06 DIAGNOSIS — Z125 Encounter for screening for malignant neoplasm of prostate: Secondary | ICD-10-CM | POA: Diagnosis not present

## 2016-06-06 DIAGNOSIS — Z0000 Encounter for general adult medical examination without abnormal findings: Secondary | ICD-10-CM | POA: Diagnosis not present

## 2016-06-06 LAB — LIPID PANEL
CHOLESTEROL: 171 mg/dL (ref 0–200)
HDL: 57.3 mg/dL (ref >39.00)
LDL CALC: 106 mg/dL — AB (ref 0–99)
NonHDL: 114.14
TRIGLYCERIDES: 42 mg/dL (ref 0.0–149.0)
Total CHOL/HDL Ratio: 3
VLDL: 8.4 mg/dL (ref 0.0–40.0)

## 2016-06-06 LAB — CBC WITH DIFFERENTIAL/PLATELET
BASOS ABS: 0 10*3/uL (ref 0.0–0.1)
BASOS PCT: 0.5 % (ref 0.0–3.0)
EOS ABS: 0.1 10*3/uL (ref 0.0–0.7)
Eosinophils Relative: 0.8 % (ref 0.0–5.0)
HCT: 40.9 % (ref 39.0–52.0)
HEMOGLOBIN: 13.8 g/dL (ref 13.0–17.0)
Lymphocytes Relative: 16.6 % (ref 12.0–46.0)
Lymphs Abs: 1.4 10*3/uL (ref 0.7–4.0)
MCHC: 33.6 g/dL (ref 30.0–36.0)
MCV: 91 fl (ref 78.0–100.0)
MONO ABS: 0.5 10*3/uL (ref 0.1–1.0)
Monocytes Relative: 5.7 % (ref 3.0–12.0)
Neutro Abs: 6.6 10*3/uL (ref 1.4–7.7)
Neutrophils Relative %: 76.4 % (ref 43.0–77.0)
Platelets: 242 10*3/uL (ref 150.0–400.0)
RBC: 4.49 Mil/uL (ref 4.22–5.81)
RDW: 13.7 % (ref 11.5–15.5)
WBC: 8.6 10*3/uL (ref 4.0–10.5)

## 2016-06-06 LAB — PSA: PSA: 2.34 ng/mL (ref 0.10–4.00)

## 2016-06-06 LAB — COMPREHENSIVE METABOLIC PANEL
ALT: 20 U/L (ref 0–53)
AST: 19 U/L (ref 0–37)
Albumin: 4 g/dL (ref 3.5–5.2)
Alkaline Phosphatase: 41 U/L (ref 39–117)
BILIRUBIN TOTAL: 0.4 mg/dL (ref 0.2–1.2)
BUN: 26 mg/dL — AB (ref 6–23)
CO2: 26 meq/L (ref 19–32)
CREATININE: 0.97 mg/dL (ref 0.40–1.50)
Calcium: 9.1 mg/dL (ref 8.4–10.5)
Chloride: 106 mEq/L (ref 96–112)
GFR: 84.22 mL/min (ref >60.00)
GLUCOSE: 115 mg/dL — AB (ref 70–99)
Potassium: 4.3 mEq/L (ref 3.5–5.1)
Sodium: 140 mEq/L (ref 135–145)
TOTAL PROTEIN: 6.4 g/dL (ref 6.0–8.3)

## 2016-06-06 LAB — POC URINALSYSI DIPSTICK (AUTOMATED)
Bilirubin, UA: NEGATIVE
Blood, UA: NEGATIVE
Glucose, UA: NEGATIVE
Ketones, UA: NEGATIVE
LEUKOCYTES UA: NEGATIVE
NITRITE UA: NEGATIVE
PROTEIN UA: NEGATIVE
Spec Grav, UA: >=1.03 — AB (ref 1.010–1.025)
Urobilinogen, UA: 0.2 E.U./dL
pH, UA: 6 (ref 5.0–8.0)

## 2016-06-06 NOTE — Addendum Note (Signed)
Addended by: Tomi Likens on: 06/06/2016 08:01 AM   Modules accepted: Orders

## 2016-06-13 ENCOUNTER — Encounter: Payer: Self-pay | Admitting: Family Medicine

## 2016-06-13 ENCOUNTER — Ambulatory Visit (INDEPENDENT_AMBULATORY_CARE_PROVIDER_SITE_OTHER): Payer: BLUE CROSS/BLUE SHIELD | Admitting: Family Medicine

## 2016-06-13 VITALS — BP 136/74 | HR 77 | Temp 98.1°F | Ht 72.5 in | Wt 237.6 lb

## 2016-06-13 DIAGNOSIS — Z23 Encounter for immunization: Secondary | ICD-10-CM

## 2016-06-13 DIAGNOSIS — Z0000 Encounter for general adult medical examination without abnormal findings: Secondary | ICD-10-CM

## 2016-06-13 DIAGNOSIS — R972 Elevated prostate specific antigen [PSA]: Secondary | ICD-10-CM | POA: Diagnosis not present

## 2016-06-13 NOTE — Progress Notes (Signed)
Pre visit review using our clinic review tool, if applicable. No additional management support is needed unless otherwise documented below in the visit note. 

## 2016-06-13 NOTE — Addendum Note (Signed)
Addended by: Mariam Dollar, Roselyn Reef M on: 06/13/2016 10:30 AM   Modules accepted: Orders

## 2016-06-13 NOTE — Patient Instructions (Addendum)
Tdap today. shingrix in 6 months  We will call you within a week or two about your referral to urology for elevated PSA. If you do not hear within 3 weeks, give [place] a call.  Can also ask them about erectile issues  Sugar likely up due to injection.   Good cholesterol up, bad cholesterol up slightly- I think the balance is still in your favor so no change in meds  Trial your topical for 2 weeks for lateral left leg- if no better- return to Dr. Noemi Chapel

## 2016-06-13 NOTE — Progress Notes (Signed)
Phone: [phone]  Subjective:  Patient presents today for their annual physical. Chief complaint-noted.   See problem oriented charting- ROS- full  review of systems was completed and negative except for: right knee pain, left lateral hip pain, left shoulder pain, erectile dysfunction  The following were reviewed and entered/updated in epic: Past Medical History:  Diagnosis Date  . Allergy    seasonal  . Arthritis   . GERD (gastroesophageal reflux disease)   . Hyperlipidemia   . Hypertension   . Sleep apnea    c pap   Patient Active Problem List   Diagnosis Date Noted  . Hyperglycemia 05/12/2014    Priority: Medium  . Obstructive sleep apnea 08/05/2013    Priority: Medium  . Obesity 03/02/2009    Priority: Medium  . Hyperlipidemia 02/23/2007    Priority: Medium  . Essential hypertension 02/23/2007    Priority: Medium  . Bursitis of left shoulder 07/21/2014    Priority: Low  . Anterior thigh numbness 07/21/2014    Priority: Low  . Tear of MCL (medial collateral ligament) of knee 06/09/2014    Priority: Low  . Personal history of adenomatous colonic polyps 01/07/2012    Priority: Low  . Allergic rhinitis 02/23/2007    Priority: Low  . GERD 02/23/2007    Priority: Low  . Acute medial meniscal tear 06/09/2014   Past Surgical History:  Procedure Laterality Date  . BACK SURGERY  2015  . other  1971   tonsilectomy    Family History  Problem Relation Age of Onset  . Lymphoma Mother     stage 3  . Cancer Mother     eventually leukemia  . Heart disease Father 37    first event and led to death  . Colon cancer Maternal Uncle   . Heart disease Paternal Uncle   . Diabetes Mother   . Cancer Sister     blood/spleen related    Medications- reviewed and updated Current Outpatient Prescriptions  Medication Sig Dispense Refill  . amLODipine-valsartan (EXFORGE) 5-160 MG tablet TAKE 1 TABLET BY MOUTH EVERY DAY 30 tablet 5  . aspirin 81 MG tablet Take 81 mg by  mouth daily.      Marland Kitchen atorvastatin (LIPITOR) 20 MG tablet TAKE 1 TABLET BY MOUTH EVERY DAY 6 PM 30 tablet 5  . desloratadine (CLARINEX) 5 MG tablet TAKE 1 TABLET BY MOUTH DAILY 30 tablet 5  . diclofenac (VOLTAREN) 75 MG EC tablet Take 1 tablet by mouth 2 (two) times daily.  3  . esomeprazole (NEXIUM) 40 MG capsule TAKE 1 CAPSULE BY MOUTH EVERY MORNING BEFORE BREAKFAST 30 capsule 5  . Multiple Vitamins-Minerals (MULTIVITAMIN WITH MINERALS) tablet Take 1 tablet by mouth daily.    Marland Kitchen PENNSAID 2 % SOLN Apply 1.5 application topically 2 (two) times daily.  2  . vitamin E 400 UNIT capsule Take 400 Units by mouth 2 (two) times daily.     Marland Kitchen XIIDRA 5 % SOLN Place 1 drop into both eyes 2 (two) times daily.  4  . mometasone (NASONEX) 50 MCG/ACT nasal spray Place 2 sprays into the nose at bedtime. (Patient not taking: Reported on 06/13/2016) 17 g 5   No current facility-administered medications for this visit.     Allergies-reviewed and updated Allergies  Allergen Reactions  . Shellfish Allergy Other (See Comments)    Sore throat    Social History   Social History  . Marital status: Married    Spouse name: N/A  .  Number of children: N/A  . Years of education: N/A   Social History Main Topics  . Smoking status: Never Smoker  . Smokeless tobacco: Never Used  . Alcohol use 0.0 oz/week     Comment: rarely  . Drug use: No  . Sexual activity: Yes   Other Topics Concern  . None   Social History Narrative   Married 1990. Wife with bile duct cancer misdiagnosed in 2008, recurred 2016, < 1 year life expectancy. 1 daughter.       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       Hobbies: golf, sports, tv             Objective: BP 136/74 (BP Location: Left Arm, Patient Position: Sitting, Cuff Size: Large)   Pulse 77   Temp 98.1 F (36.7 C) (Oral)   Ht 6' 0.5" (1.842 m)   Wt 237 lb 9.6 oz (107.8 kg)   SpO2 95%   BMI 31.78 kg/m  Gen: NAD, resting comfortably HEENT: Mucous membranes  are moist. Oropharynx normal Neck: no thyromegaly CV: RRR no murmurs rubs or gallops Lungs: CTAB no crackles, wheeze, rhonchi Abdomen: soft/nontender/nondistended/normal bowel sounds. No rebound or guarding.  Ext: no edema Skin: warm, dry Neuro: grossly normal, moves all extremities, PERRLA Declines rectal exam as will do at urolog  Assessment/Plan:  61 y.o. male presenting for annual physical.  Health Maintenance counseling: 1. Anticipatory guidance: Patient counseled regarding regular dental exams , eye exams , wearing seatbelts.  2. Risk factor reduction:  Advised patient of need for regular exercise and diet rich and fruits and vegetables to reduce risk of heart attack and stroke. Exercise- hitting some difficult patches finding right exercise given pains he is having. Diet-Still doing Ashwini Jago gundry diet evolution. Lost another 11 lbs Wt Readings from Last 3 Encounters:  06/13/16 237 lb 9.6 oz (107.8 kg)  12/14/15 248 lb 3.2 oz (112.6 kg)  12/14/15 249 lb (112.9 kg)   3. Immunizations/screenings/ancillary studies- Tdap today. shingrix in 6 months Immunization History  Administered Date(s) Administered  . Influenza Split 12/14/2011, 03/13/2013  . Influenza Whole 12/11/2009  . Influenza,inj,Quad PF,36+ Mos 12/02/2013, 12/08/2014, 12/14/2015  . Td 02/10/2006  4. Prostate cancer screening-  PSA has bumped up over 0.8. Will refer to urology Lab Results  Component Value Date   PSA 2.34 06/06/2016   PSA 1.47 05/14/2015   PSA 1.57 05/09/2014   5. Colon cancer screening - 01/07/12 with 5 year repeat- so will be later this year 6. Skin cancer screening- saw dermatology last september  Status of chronic or acute concerns  HTN - controlled on amlodipine-valsartan 5-160mg   HLD- mild poorly controlled on lipitor 20mg . HDL up 13 points with weight loss and more exercise but LDL above 100- continue to trend and work on weight loss  OSA on CPAP  Hyperglycemia- luckily last a1c not  above 5.7. CBG at 115 but 2 steroid shots last week R Bursa knee, rotator l shoulder- better with injection  Lateral thigh and down into leg with some pain- could be from back. Trial pensaid and return to Dr. Noemi Chapel if not improving  Return in about 6 months (around 12/14/2016) for follow up- or sooner if needed.  Orders Placed This Encounter  Procedures  . Ambulatory referral to Urology    Referral Priority:   Routine    Referral Type:   Consultation    Referral Reason:   Specialty Services Required    Requested Specialty:   Urology  Number of Visits Requested:   1   Meds ordered this encounter  Medications  . diclofenac (VOLTAREN) 75 MG EC tablet    Sig: Take 1 tablet by mouth 2 (two) times daily.    Refill:  3  . PENNSAID 2 % SOLN    Sig: Apply 1.5 application topically 2 (two) times daily.    Refill:  2  . XIIDRA 5 % SOLN    Sig: Place 1 drop into both eyes 2 (two) times daily.    Refill:  4  . vitamin E 400 UNIT capsule    Sig: Take 400 Units by mouth 2 (two) times daily.     Return precautions advised.  Garret Reddish, MD

## 2016-06-19 ENCOUNTER — Other Ambulatory Visit: Payer: Self-pay | Admitting: Family Medicine

## 2016-07-17 ENCOUNTER — Other Ambulatory Visit: Payer: Self-pay | Admitting: Family Medicine

## 2016-08-01 DIAGNOSIS — N5201 Erectile dysfunction due to arterial insufficiency: Secondary | ICD-10-CM | POA: Diagnosis not present

## 2016-08-01 DIAGNOSIS — N529 Male erectile dysfunction, unspecified: Secondary | ICD-10-CM | POA: Diagnosis not present

## 2016-08-01 DIAGNOSIS — R972 Elevated prostate specific antigen [PSA]: Secondary | ICD-10-CM | POA: Diagnosis not present

## 2016-08-01 LAB — PSA
PSA: 2.16
PSA: 2.16

## 2016-08-05 DIAGNOSIS — E785 Hyperlipidemia, unspecified: Secondary | ICD-10-CM | POA: Diagnosis not present

## 2016-08-05 DIAGNOSIS — G4733 Obstructive sleep apnea (adult) (pediatric): Secondary | ICD-10-CM | POA: Diagnosis not present

## 2016-08-06 ENCOUNTER — Encounter: Payer: Self-pay | Admitting: Family Medicine

## 2016-08-11 ENCOUNTER — Telehealth: Payer: Self-pay | Admitting: Internal Medicine

## 2016-08-11 NOTE — Telephone Encounter (Signed)
Spoke with pt. States that he is needing some disability insurance forms filled out. He wanted to know if we have received anything on him.  CY/KW - please advise. Thanks.

## 2016-08-11 NOTE — Telephone Encounter (Signed)
lmtcb x1 for pt. 

## 2016-08-11 NOTE — Telephone Encounter (Signed)
Nothing has come through as of today-please see if he went through [employer]. If so they will send over once he has paid the fee. If he has not please inform him to go through [employer] for forms to be completed. Thanks.

## 2016-08-11 NOTE — Telephone Encounter (Signed)
Spoke with pt and he stated he was informed that we have already completed our part and this was faxed back last week. I do not see any documentation of this. I looked in pt's scans and do see forms for disability scanned in his chart. Pt states he does not need anything further from [place].

## 2016-08-11 NOTE — Telephone Encounter (Signed)
Patient returned call [phone].

## 2016-09-09 DIAGNOSIS — M7051 Other bursitis of knee, right knee: Secondary | ICD-10-CM | POA: Diagnosis not present

## 2016-09-09 DIAGNOSIS — S46012A Strain of muscle(s) and tendon(s) of the rotator cuff of left shoulder, initial encounter: Secondary | ICD-10-CM | POA: Diagnosis not present

## 2016-09-12 DIAGNOSIS — H16223 Keratoconjunctivitis sicca, not specified as Sjogren's, bilateral: Secondary | ICD-10-CM | POA: Diagnosis not present

## 2016-09-12 DIAGNOSIS — H04123 Dry eye syndrome of bilateral lacrimal glands: Secondary | ICD-10-CM | POA: Diagnosis not present

## 2016-09-13 DIAGNOSIS — M25512 Pain in left shoulder: Secondary | ICD-10-CM | POA: Diagnosis not present

## 2016-09-13 DIAGNOSIS — M25561 Pain in right knee: Secondary | ICD-10-CM | POA: Diagnosis not present

## 2016-09-23 ENCOUNTER — Telehealth: Payer: Self-pay | Admitting: Family Medicine

## 2016-09-23 NOTE — Telephone Encounter (Signed)
He was referred in May- what happened for him to be seen in November? He has private insurance- there are not other urologists in town I am aware of- he can certainly call and see if he can be seen sooner in [place] or [place].   It has been almost 4 months- how about we go ahead and do another PSA now under elevated PSA (possibly could have even gone back down). Could he do this within a week.

## 2016-09-23 NOTE — Telephone Encounter (Signed)
Pt has a referral to see dr Jeffie Pollock and can not be seen until nov 2018. Pt would like another referral to another urologist. Pt does not want to wait

## 2016-09-24 NOTE — Telephone Encounter (Signed)
Spoke with patient who states he was seen at Urology once and has a follow up scheduled in November. He wanted to be seen prior to November. Patient is considering going to [facility] for Urology. I offered to have him PSA but wants to hold off at this time as he plans to go ahead and schedule with [facility]. He stated he though Dr. Roni Bread drew a PSA when he was there

## 2016-10-11 DIAGNOSIS — C801 Malignant (primary) neoplasm, unspecified: Secondary | ICD-10-CM

## 2016-10-11 HISTORY — PX: PROSTATE BIOPSY: SHX241

## 2016-10-11 HISTORY — DX: Malignant (primary) neoplasm, unspecified: C80.1

## 2016-10-21 ENCOUNTER — Other Ambulatory Visit: Payer: Self-pay | Admitting: Family Medicine

## 2016-10-29 ENCOUNTER — Encounter: Payer: Self-pay | Admitting: Family Medicine

## 2016-10-29 ENCOUNTER — Encounter: Payer: Self-pay | Admitting: Internal Medicine

## 2016-10-31 DIAGNOSIS — R972 Elevated prostate specific antigen [PSA]: Secondary | ICD-10-CM | POA: Diagnosis not present

## 2016-10-31 LAB — PSA: PSA: 2.54

## 2016-11-03 ENCOUNTER — Encounter: Payer: Self-pay | Admitting: Family Medicine

## 2016-11-06 ENCOUNTER — Encounter: Payer: Self-pay | Admitting: Family Medicine

## 2016-11-06 ENCOUNTER — Ambulatory Visit (INDEPENDENT_AMBULATORY_CARE_PROVIDER_SITE_OTHER): Payer: BLUE CROSS/BLUE SHIELD | Admitting: Family Medicine

## 2016-11-06 VITALS — BP 122/78 | HR 76 | Temp 98.1°F | Ht 72.5 in | Wt 237.4 lb

## 2016-11-06 DIAGNOSIS — E785 Hyperlipidemia, unspecified: Secondary | ICD-10-CM | POA: Diagnosis not present

## 2016-11-06 DIAGNOSIS — I10 Essential (primary) hypertension: Secondary | ICD-10-CM

## 2016-11-06 DIAGNOSIS — G4733 Obstructive sleep apnea (adult) (pediatric): Secondary | ICD-10-CM

## 2016-11-06 DIAGNOSIS — M79674 Pain in right toe(s): Secondary | ICD-10-CM

## 2016-11-06 DIAGNOSIS — R739 Hyperglycemia, unspecified: Secondary | ICD-10-CM

## 2016-11-06 DIAGNOSIS — Z23 Encounter for immunization: Secondary | ICD-10-CM

## 2016-11-06 NOTE — Progress Notes (Signed)
Subjective:  Billy Todd is a 61 y.o. year old very pleasant male patient who presents for/with See problem oriented charting ROS- No chest pain or shortness of breath. No headache or blurry vision.    Past Medical History-  Patient Active Problem List   Diagnosis Date Noted  . Hyperglycemia 05/12/2014    Priority: Medium  . Obstructive sleep apnea 08/05/2013    Priority: Medium  . Obesity 03/02/2009    Priority: Medium  . Hyperlipidemia 02/23/2007    Priority: Medium  . Essential hypertension 02/23/2007    Priority: Medium  . Bursitis of left shoulder 07/21/2014    Priority: Low  . Anterior thigh numbness 07/21/2014    Priority: Low  . Tear of MCL (medial collateral ligament) of knee 06/09/2014    Priority: Low  . Personal history of adenomatous colonic polyps 01/07/2012    Priority: Low  . Allergic rhinitis 02/23/2007    Priority: Low  . GERD 02/23/2007    Priority: Low  . Acute medial meniscal tear 06/09/2014    Medications- reviewed and updated Current Outpatient Prescriptions  Medication Sig Dispense Refill  . amLODipine-valsartan (EXFORGE) 5-160 MG tablet TAKE 1 TABLET BY MOUTH EVERY DAY 90 tablet 1  . aspirin 81 MG tablet Take 81 mg by mouth daily.      Marland Kitchen atorvastatin (LIPITOR) 20 MG tablet TAKE 1 TABLET BY MOUTH EVERY DAY 6 PM 30 tablet 5  . desloratadine (CLARINEX) 5 MG tablet TAKE 1 TABLET BY MOUTH DAILY 90 tablet 1  . esomeprazole (NEXIUM) 40 MG capsule TAKE 1 CAPSULE BY MOUTH EVERY MORNING BEFORE BREAKFAST 30 capsule 5  . mometasone (NASONEX) 50 MCG/ACT nasal spray Place 2 sprays into the nose at bedtime. 17 g 5  . Multiple Vitamins-Minerals (MULTIVITAMIN WITH MINERALS) tablet Take 1 tablet by mouth daily.    Marland Kitchen PENNSAID 2 % SOLN Apply 1.5 application topically 2 (two) times daily.  2  . vitamin E 400 UNIT capsule Take 400 Units by mouth 2 (two) times daily.     Marland Kitchen XIIDRA 5 % SOLN Place 1 drop into both eyes 2 (two) times daily.  4   Objective: BP 122/78    Pulse 76   Temp 98.1 F (36.7 C) (Oral)   Ht 6' 0.5" (1.842 m)   Wt 237 lb 6.4 oz (107.7 kg)   SpO2 97%   BMI 31.75 kg/m  Gen: NAD, resting comfortably CV: RRR no murmurs rubs or gallops Lungs: CTAB no crackles, wheeze, rhonchi Abdomen: obese Ext: no edema Skin: warm, dry MSK: right 5th toe- pain with palpation proximal to DIP joint. Bruising over top of foot.   EKG: sinus rhythm with rate 64, normal axis, normal intervals, no hypertrophy, flat t wave in v6. No obvious ischemic changes   Assessment/Plan:  Hypertension S: controlled on amlodipine-valsartan 5-160mg . .  BP Readings from Last 3 Encounters:  11/06/16 122/78  06/13/16 136/74  12/14/15 116/72  A/P: We discussed blood pressure goal of <140/90. Continue current meds.   Hyperlipidemia S: lipids reasonably controlled on lipitor 20mg . No myalgias.  Lab Results  Component Value Date   CHOL 171 06/06/2016   HDL 57.30 06/06/2016   LDLCALC 106 (H) 06/06/2016   LDLDIRECT 113.0 12/14/2015   TRIG 42.0 06/06/2016   CHOLHDL 3 06/06/2016   A/P: would prefer LDL under 100 but near goal and working on continued weight loss- will not adjust medication  Compliant with sleep apnea treatment on cpap nightly. Having records sent over from  pulmonary.   Has been at risk for diabetes in the past based on cbgs- last a1c was not elevated- but has lost significant weight   Preoperative evaluation S: Planned penile implant to help with ED. Hypertension controlled, hyperlipidemia reaosnably controlled- working on weight loss to get LDL under 100. Compliant with CPAP.   Able to complete 4 METS of activity without chest pain or shortness of breath A/P: Medically maximized for surgery- appears to be low risk surgery as well.   Also stubbed right 5th toe- possible fracture. 2 weeks ago- significant swelling. Discussed elevation, ice, tylenol. Could use walking shoe but declines. Declines imaging. Discussed 6 weeks healing. Discussed  buddy taping Future Appointments Date Time Provider [place]  12/15/2016 9:00 AM Deneise Lever, MD [facility] None  12/19/2016 9:00 AM Yong Channel Brayton Mars, MD [facility] None   Orders Placed This Encounter  Procedures  . Flu Vaccine QUAD 36+ mos IM  . EKG 12-Lead   Return precautions advised.  Garret Reddish, MD

## 2016-11-06 NOTE — Patient Instructions (Signed)
Thanks for getting your flu shot  You are medically maximized for surgery- will send them note and EKG from today

## 2016-11-10 DIAGNOSIS — N5201 Erectile dysfunction due to arterial insufficiency: Secondary | ICD-10-CM | POA: Diagnosis not present

## 2016-11-10 DIAGNOSIS — R972 Elevated prostate specific antigen [PSA]: Secondary | ICD-10-CM | POA: Diagnosis not present

## 2016-11-12 ENCOUNTER — Encounter: Payer: Self-pay | Admitting: Family Medicine

## 2016-11-17 DIAGNOSIS — R972 Elevated prostate specific antigen [PSA]: Secondary | ICD-10-CM | POA: Diagnosis not present

## 2016-11-17 DIAGNOSIS — C61 Malignant neoplasm of prostate: Secondary | ICD-10-CM | POA: Diagnosis not present

## 2016-11-18 ENCOUNTER — Encounter: Payer: Self-pay | Admitting: Family Medicine

## 2016-11-18 ENCOUNTER — Other Ambulatory Visit: Payer: Self-pay | Admitting: Family Medicine

## 2016-11-18 LAB — TESTOSTERONE, TOTAL, LC/MS: TESTOSTERONE: 263.7

## 2016-11-26 DIAGNOSIS — Z01818 Encounter for other preprocedural examination: Secondary | ICD-10-CM | POA: Diagnosis not present

## 2016-12-02 DIAGNOSIS — N529 Male erectile dysfunction, unspecified: Secondary | ICD-10-CM | POA: Diagnosis not present

## 2016-12-02 DIAGNOSIS — N486 Induration penis plastica: Secondary | ICD-10-CM | POA: Diagnosis not present

## 2016-12-02 DIAGNOSIS — G4730 Sleep apnea, unspecified: Secondary | ICD-10-CM | POA: Diagnosis not present

## 2016-12-02 DIAGNOSIS — Z01818 Encounter for other preprocedural examination: Secondary | ICD-10-CM | POA: Diagnosis not present

## 2016-12-03 DIAGNOSIS — R351 Nocturia: Secondary | ICD-10-CM | POA: Diagnosis not present

## 2016-12-03 DIAGNOSIS — C61 Malignant neoplasm of prostate: Secondary | ICD-10-CM | POA: Diagnosis not present

## 2016-12-03 DIAGNOSIS — N529 Male erectile dysfunction, unspecified: Secondary | ICD-10-CM | POA: Diagnosis not present

## 2016-12-03 DIAGNOSIS — Z01818 Encounter for other preprocedural examination: Secondary | ICD-10-CM | POA: Diagnosis not present

## 2016-12-03 DIAGNOSIS — N486 Induration penis plastica: Secondary | ICD-10-CM | POA: Diagnosis not present

## 2016-12-05 ENCOUNTER — Encounter: Payer: Self-pay | Admitting: Family Medicine

## 2016-12-05 DIAGNOSIS — I10 Essential (primary) hypertension: Secondary | ICD-10-CM | POA: Diagnosis not present

## 2016-12-05 DIAGNOSIS — N4889 Other specified disorders of penis: Secondary | ICD-10-CM | POA: Diagnosis not present

## 2016-12-05 DIAGNOSIS — C61 Malignant neoplasm of prostate: Secondary | ICD-10-CM | POA: Diagnosis not present

## 2016-12-05 DIAGNOSIS — Z7982 Long term (current) use of aspirin: Secondary | ICD-10-CM | POA: Diagnosis not present

## 2016-12-05 DIAGNOSIS — E7800 Pure hypercholesterolemia, unspecified: Secondary | ICD-10-CM | POA: Diagnosis not present

## 2016-12-05 DIAGNOSIS — G4730 Sleep apnea, unspecified: Secondary | ICD-10-CM | POA: Diagnosis not present

## 2016-12-05 DIAGNOSIS — N480 Leukoplakia of penis: Secondary | ICD-10-CM | POA: Diagnosis not present

## 2016-12-05 DIAGNOSIS — N528 Other male erectile dysfunction: Secondary | ICD-10-CM | POA: Diagnosis not present

## 2016-12-05 DIAGNOSIS — N529 Male erectile dysfunction, unspecified: Secondary | ICD-10-CM | POA: Diagnosis not present

## 2016-12-10 ENCOUNTER — Encounter: Payer: Self-pay | Admitting: Family Medicine

## 2016-12-15 ENCOUNTER — Ambulatory Visit (INDEPENDENT_AMBULATORY_CARE_PROVIDER_SITE_OTHER): Payer: BLUE CROSS/BLUE SHIELD | Admitting: Internal Medicine

## 2016-12-15 ENCOUNTER — Encounter: Payer: Self-pay | Admitting: Internal Medicine

## 2016-12-15 DIAGNOSIS — G4733 Obstructive sleep apnea (adult) (pediatric): Secondary | ICD-10-CM | POA: Diagnosis not present

## 2016-12-15 NOTE — Patient Instructions (Signed)
We can continue CPAP auto 1--20, mask of choice, humidifier, supplies, AirView   Dx OSA  Please call as needed

## 2016-12-15 NOTE — Progress Notes (Signed)
HPI M never smoker followed for OSA, complicated by obesity, HBP, allergic rhinitis, GERD NPSG 10/06/13- moderate OSA, AHI 24.7/ hr, loud snoring, weight 250 pounds  ------------------------------------------------------------------  12/14/2015-61 year old male never smoker followed for OSA, complicated by obesity, HBP, allergic rhinitis CPAP 20/Advanced or on-line-Transcend portable for travel FOLLOWS FOR: yearly OSA follow up, wears CPAP every night x6-8hrs.  denies any mask or pressure issues. Now uses a standard CPAP machine while at home and download indicates setting 16 with good compliance and control. This machine is getting older. He uses Transcend machine for travel. Otherwise breathing is good with no cardiopulmonary complications. He accepts flu shot.  12/15/16- 61 year old male never smoker followed for OSA, complicated by obesity, HBP, allergic rhinitis CPAP auto 10-20 /Advanced or on-line-Transcend portable for travel ---Pt is doing well and sleeping well over all He uses his standard CPAP at home. When he travels he uses Transcend machine. For short trips he doesn't bother with humidifier, but it is more noisy that way so he does take the humidifier on longer trips. Aware of leak but it doesn't bother him. Download 97% compliance averaging 7 hours per night, AHI 1.2/hour  ROS-see HPI + = positive Constitutional:   No-   weight loss, night sweats, fevers, chills, fatigue, lassitude. HEENT:   No-  headaches, difficulty swallowing, tooth/dental problems, sore throat,       No-  sneezing, itching, ear ache, nasal congestion, post nasal drip,  CV:  No-   chest pain, orthopnea, PND, swelling in lower extremities, anasarca,                                                     dizziness, palpitations Resp: No-   shortness of breath with exertion or at rest.              No-   productive cough,  No non-productive cough,  No- coughing up of blood.              No-   change in color of  mucus.  No- wheezing.   Skin: No-   rash or lesions. GI:  No-   heartburn, indigestion, abdominal pain, nausea, vomiting,  GU:  MS:  No-   joint pain or swelling.   Neuro-     nothing unusual Psych:  No- change in mood or affect. No depression or anxiety.  No memory loss.  OBJ- Physical Exam General- Alert, Oriented, Affect-appropriate, Distress- none acute, +overweight Skin- rash-none, lesions- none, excoriation- none Lymphadenopathy- none Head- atraumatic            Eyes- Gross vision intact, PERRLA, conjunctivae and secretions clear            Ears- Hearing, canals-normal            Nose- Clear, no-Septal dev, mucus, polyps, erosion, perforation             Throat- Mallampati III , mucosa clear , drainage- none, tonsils- atrophic Neck- flexible , trachea midline, no stridor , thyroid nl, carotid no bruit Chest - symmetrical excursion , unlabored           Heart/CV- RRR , no murmur , no gallop  , no rub, nl s1 s2                           -  JVD- none , edema- none, stasis changes- none, varices- none           Lung- clear to P&A, wheeze- none, cough- none , dullness-none, rub- none           Chest wall-  Abd-  Br/ Gen/ Rectal- Not done, not indicated Extrem- cyanosis- none, clubbing, none, atrophy- none, strength- nl Neuro- grossly intact to observation

## 2016-12-15 NOTE — Assessment & Plan Note (Signed)
He benefits and doesn't like to sleep without CPAP. Download confirms excellent compliance and control. His Transcend machine works well for travel.

## 2016-12-16 DIAGNOSIS — C61 Malignant neoplasm of prostate: Secondary | ICD-10-CM | POA: Diagnosis not present

## 2016-12-16 DIAGNOSIS — D414 Neoplasm of uncertain behavior of bladder: Secondary | ICD-10-CM | POA: Diagnosis not present

## 2016-12-19 ENCOUNTER — Ambulatory Visit: Payer: BLUE CROSS/BLUE SHIELD | Admitting: Family Medicine

## 2016-12-19 ENCOUNTER — Encounter: Payer: Self-pay | Admitting: Family Medicine

## 2016-12-19 VITALS — BP 112/66 | HR 78 | Temp 98.2°F | Ht 72.0 in | Wt 225.8 lb

## 2016-12-19 DIAGNOSIS — Z4802 Encounter for removal of sutures: Secondary | ICD-10-CM

## 2016-12-19 DIAGNOSIS — I10 Essential (primary) hypertension: Secondary | ICD-10-CM | POA: Diagnosis not present

## 2016-12-19 DIAGNOSIS — E785 Hyperlipidemia, unspecified: Secondary | ICD-10-CM

## 2016-12-19 DIAGNOSIS — C61 Malignant neoplasm of prostate: Secondary | ICD-10-CM

## 2016-12-19 MED ORDER — MOMETASONE FUROATE 50 MCG/ACT NA SUSP: 2.0000 | Freq: Every day | NASAL | 5 refills | Status: DC

## 2016-12-19 MED ORDER — DESLORATADINE 5 MG PO TABS: 5.0000 mg | ORAL_TABLET | Freq: Every day | ORAL | 1 refills | Status: DC

## 2016-12-19 MED ORDER — ATORVASTATIN CALCIUM 20 MG PO TABS: ORAL_TABLET | ORAL | 5 refills | Status: DC

## 2016-12-19 MED ORDER — ESOMEPRAZOLE MAGNESIUM 40 MG PO CPDR: DELAYED_RELEASE_CAPSULE | ORAL | 5 refills | Status: DC

## 2016-12-19 MED ORDER — AMLODIPINE BESYLATE-VALSARTAN 5-160 MG PO TABS: 1.0000 | ORAL_TABLET | Freq: Every day | ORAL | 1 refills | Status: DC

## 2016-12-19 NOTE — Assessment & Plan Note (Signed)
S: mild poorly controlled on atorvastatin 20mg . Has wanted to work on weight loss instead of adding medication- he is down 12 lbs from last visit! . No myalgias.  Lab Results  Component Value Date   CHOL 171 06/06/2016   HDL 57.30 06/06/2016   LDLCALC 106 (H) 06/06/2016   LDLDIRECT 113.0 12/14/2015   TRIG 42.0 06/06/2016   CHOLHDL 3 06/06/2016   A/P: hopeful for improvement on next labs- suspect will be at goal

## 2016-12-19 NOTE — Progress Notes (Signed)
Subjective:  Billy Todd is a 61 y.o. year old very pleasant male patient who presents for/with See problem oriented charting ROS- some scrotal and penile tenderness- improving. No chest pain or shortness of breath. No headache or blurry vision. Losing weight with healthy exercise   Past Medical History-  Patient Active Problem List   Diagnosis Date Noted  . Hyperglycemia 05/12/2014    Priority: Medium  . Obstructive sleep apnea 08/05/2013    Priority: Medium  . Obesity 03/02/2009    Priority: Medium  . Hyperlipidemia 02/23/2007    Priority: Medium  . Essential hypertension 02/23/2007    Priority: Medium  . Bursitis of left shoulder 07/21/2014    Priority: Low  . Anterior thigh numbness 07/21/2014    Priority: Low  . Tear of MCL (medial collateral ligament) of knee 06/09/2014    Priority: Low  . Personal history of adenomatous colonic polyps 01/07/2012    Priority: Low  . Allergic rhinitis 02/23/2007    Priority: Low  . GERD 02/23/2007    Priority: Low  . Prostate cancer ([place]) 12/19/2016  . Acute medial meniscal tear 06/09/2014    Medications- reviewed and updated Current Outpatient Medications  Medication Sig Dispense Refill  . amLODipine-valsartan (EXFORGE) 5-160 MG tablet Take 1 tablet daily by mouth. 90 tablet 1  . aspirin 81 MG tablet Take 81 mg by mouth daily.      Marland Kitchen atorvastatin (LIPITOR) 20 MG tablet TAKE 1 TABLET BY MOUTH EVERY DAY 6 PM 30 tablet 5  . desloratadine (CLARINEX) 5 MG tablet Take 1 tablet (5 mg total) daily by mouth. 90 tablet 1  . esomeprazole (NEXIUM) 40 MG capsule TAKE 1 CAPSULE BY MOUTH EVERY MORNING BEFORE BREAKFAST 30 capsule 5  . mometasone (NASONEX) 50 MCG/ACT nasal spray Place 2 sprays at bedtime into the nose. 17 g 5  . Multiple Vitamins-Minerals (MULTIVITAMIN WITH MINERALS) tablet Take 1 tablet by mouth daily.    Marland Kitchen PENNSAID 2 % SOLN Apply 1.5 application topically 2 (two) times daily.  2  . vitamin E 400 UNIT capsule Take 400 Units by  mouth 2 (two) times daily.     Marland Kitchen XIIDRA 5 % SOLN Place 1 drop into both eyes 2 (two) times daily.  4   Objective: BP 112/66 (BP Location: Left Arm, Patient Position: Sitting, Cuff Size: Large)   Pulse 78   Temp 98.2 F (36.8 C) (Oral)   Ht 6' (1.829 m)   Wt 225 lb 12.8 oz (102.4 kg)   SpO2 96%   BMI 30.62 kg/m  Gen: NAD, resting comfortably CV: RRR no murmurs rubs or gallops Lungs: CTAB no crackles, wheeze, rhonchi GU: 4 vertical mattress sutures in place on scrotum. Removed with 11 blade without difficulty. No signs of infection- wound pulls open slightly at each incision site- steri strips #3 applied Ext: no edema Skin: warm, dry  Assessment/Plan:  Hypertension S: controlled on amlodipine-valsartan 5-160mg .  BP Readings from Last 3 Encounters:  12/19/16 112/66  12/15/16 118/78  11/06/16 122/78  A/P: We discussed blood pressure goal of <140/90. Continue current meds  Penile implant sutures S:  stitches for penile implant today to be removed today A/P: tolerated removal of #4 sutures today- no signs of infection. Steri strips applied with benzoin to help with adhesion   Hyperlipidemia S: mild poorly controlled on atorvastatin 20mg . Has wanted to work on weight loss instead of adding medication- he is down 12 lbs from last visit! . No myalgias.  Lab Results  Component Value Date   CHOL 171 06/06/2016   HDL 57.30 06/06/2016   LDLCALC 106 (H) 06/06/2016   LDLDIRECT 113.0 12/14/2015   TRIG 42.0 06/06/2016   CHOLHDL 3 06/06/2016   A/P: hopeful for improvement on next labs- suspect will be at goal  Prostate cancer [facility]) Informs me of new diagnosis. Gleason 3+3=6 on 2/12 biopsies but only 5-10 % of each core. [facility]- [facility] for treatment/monitoring- planned watchful waiting.    Future Appointments  Date Time Provider [place]  12/15/2017  9:00 AM Deneise Lever, MD [facility] None    Meds ordered this encounter  Medications  .  mometasone (NASONEX) 50 MCG/ACT nasal spray    Sig: Place 2 sprays at bedtime into the nose.    Dispense:  17 g    Refill:  5  . amLODipine-valsartan (EXFORGE) 5-160 MG tablet    Sig: Take 1 tablet daily by mouth.    Dispense:  90 tablet    Refill:  1  . atorvastatin (LIPITOR) 20 MG tablet    Sig: TAKE 1 TABLET BY MOUTH EVERY DAY 6 PM    Dispense:  30 tablet    Refill:  5  . desloratadine (CLARINEX) 5 MG tablet    Sig: Take 1 tablet (5 mg total) daily by mouth.    Dispense:  90 tablet    Refill:  1  . esomeprazole (NEXIUM) 40 MG capsule    Sig: TAKE 1 CAPSULE BY MOUTH EVERY MORNING BEFORE BREAKFAST    Dispense:  30 capsule    Refill:  5    Return precautions advised.  Garret Reddish, MD

## 2016-12-19 NOTE — Assessment & Plan Note (Signed)
Informs me of new diagnosis. Gleason 3+3=6 on 2/12 biopsies but only 5-10 % of each core. [facility]- plans to go to [facility] for treatment/monitoring- planned watchful waiting.

## 2016-12-19 NOTE — Patient Instructions (Signed)
You did great! No signs of infection- wound looks great  [place] job on weight loss as well  Stirps should fall off within 2 weeks

## 2017-01-09 ENCOUNTER — Other Ambulatory Visit: Payer: Self-pay | Admitting: Family Medicine

## 2017-02-06 ENCOUNTER — Encounter: Payer: Self-pay | Admitting: Internal Medicine

## 2017-02-09 DIAGNOSIS — G4733 Obstructive sleep apnea (adult) (pediatric): Secondary | ICD-10-CM | POA: Diagnosis not present

## 2017-02-09 DIAGNOSIS — E785 Hyperlipidemia, unspecified: Secondary | ICD-10-CM | POA: Diagnosis not present

## 2017-02-11 DIAGNOSIS — C61 Malignant neoplasm of prostate: Secondary | ICD-10-CM | POA: Diagnosis not present

## 2017-02-12 DIAGNOSIS — R5381 Other malaise: Secondary | ICD-10-CM | POA: Diagnosis not present

## 2017-03-31 ENCOUNTER — Encounter: Payer: Self-pay | Admitting: Internal Medicine

## 2017-03-31 ENCOUNTER — Other Ambulatory Visit: Payer: Self-pay | Admitting: Urology

## 2017-03-31 DIAGNOSIS — C61 Malignant neoplasm of prostate: Secondary | ICD-10-CM

## 2017-04-24 ENCOUNTER — Other Ambulatory Visit: Payer: BLUE CROSS/BLUE SHIELD

## 2017-05-08 ENCOUNTER — Ambulatory Visit
Admission: RE | Admit: 2017-05-08 | Discharge: 2017-05-08 | Disposition: A | Discharge status: Home / Self Care | Payer: BLUE CROSS/BLUE SHIELD | Source: Ambulatory Visit | Attending: Urology | Admitting: Urology

## 2017-05-08 DIAGNOSIS — C61 Malignant neoplasm of prostate: Secondary | ICD-10-CM

## 2017-05-18 ENCOUNTER — Other Ambulatory Visit: Payer: Self-pay | Admitting: Urology

## 2017-05-18 DIAGNOSIS — C61 Malignant neoplasm of prostate: Secondary | ICD-10-CM

## 2017-05-19 DIAGNOSIS — G4733 Obstructive sleep apnea (adult) (pediatric): Secondary | ICD-10-CM | POA: Diagnosis not present

## 2017-05-28 ENCOUNTER — Ambulatory Visit
Admission: RE | Admit: 2017-05-28 | Discharge: 2017-05-28 | Disposition: A | Discharge status: Home / Self Care | Payer: BLUE CROSS/BLUE SHIELD | Source: Ambulatory Visit | Attending: Urology | Admitting: Urology

## 2017-05-28 ENCOUNTER — Other Ambulatory Visit: Payer: Self-pay | Admitting: Urology

## 2017-05-28 DIAGNOSIS — C61 Malignant neoplasm of prostate: Secondary | ICD-10-CM

## 2017-05-28 MED ORDER — GADOBENATE DIMEGLUMINE 529 MG/ML IV SOLN
20.0000 mL | Freq: Once | INTRAVENOUS | Status: AC | PRN
  Administered 2017-05-28: 20 mL via INTRAVENOUS

## 2017-06-12 ENCOUNTER — Other Ambulatory Visit: Payer: Self-pay

## 2017-06-12 ENCOUNTER — Ambulatory Visit (AMBULATORY_SURGERY_CENTER): Payer: Self-pay | Admitting: *Deleted

## 2017-06-12 VITALS — Ht 72.0 in | Wt 227.0 lb

## 2017-06-12 DIAGNOSIS — Z86010 Personal history of colonic polyps: Secondary | ICD-10-CM

## 2017-06-12 NOTE — Progress Notes (Signed)
Patient denies any allergies to eggs or soy. Patient denies any problems with anesthesia/sedation. Patient denies any oxygen use at home. Patient denies taking any diet/weight loss medications or blood thinners. EMMI education declined by the pt.  

## 2017-06-13 ENCOUNTER — Other Ambulatory Visit: Payer: Self-pay | Admitting: Family Medicine

## 2017-06-15 ENCOUNTER — Other Ambulatory Visit: Payer: Self-pay | Admitting: Family Medicine

## 2017-06-18 ENCOUNTER — Encounter: Payer: Self-pay | Admitting: Family Medicine

## 2017-06-18 ENCOUNTER — Ambulatory Visit (INDEPENDENT_AMBULATORY_CARE_PROVIDER_SITE_OTHER): Payer: BLUE CROSS/BLUE SHIELD | Admitting: Family Medicine

## 2017-06-18 ENCOUNTER — Encounter

## 2017-06-18 VITALS — BP 120/78 | HR 76 | Temp 98.1°F | Ht 72.0 in | Wt 229.0 lb

## 2017-06-18 DIAGNOSIS — K219 Gastro-esophageal reflux disease without esophagitis: Secondary | ICD-10-CM | POA: Diagnosis not present

## 2017-06-18 DIAGNOSIS — I10 Essential (primary) hypertension: Secondary | ICD-10-CM | POA: Diagnosis not present

## 2017-06-18 DIAGNOSIS — E785 Hyperlipidemia, unspecified: Secondary | ICD-10-CM | POA: Diagnosis not present

## 2017-06-18 DIAGNOSIS — Z0000 Encounter for general adult medical examination without abnormal findings: Secondary | ICD-10-CM

## 2017-06-18 DIAGNOSIS — R739 Hyperglycemia, unspecified: Secondary | ICD-10-CM

## 2017-06-18 LAB — COMPREHENSIVE METABOLIC PANEL
ALBUMIN: 3.9 g/dL (ref 3.5–5.2)
ALT: 15 U/L (ref 0–53)
AST: 16 U/L (ref 0–37)
Alkaline Phosphatase: 42 U/L (ref 39–117)
BUN: 14 mg/dL (ref 6–23)
CALCIUM: 9.1 mg/dL (ref 8.4–10.5)
CHLORIDE: 105 meq/L (ref 96–112)
CO2: 30 mEq/L (ref 19–32)
Creatinine, Ser: 0.99 mg/dL (ref 0.40–1.50)
GFR: 81.97 mL/min (ref >60.00)
Glucose, Bld: 103 mg/dL — ABNORMAL HIGH (ref 70–99)
Potassium: 4.5 mEq/L (ref 3.5–5.1)
SODIUM: 140 meq/L (ref 135–145)
Total Bilirubin: 0.4 mg/dL (ref 0.2–1.2)
Total Protein: 6.4 g/dL (ref 6.0–8.3)

## 2017-06-18 LAB — LIPID PANEL
CHOLESTEROL: 160 mg/dL (ref 0–200)
HDL: 63 mg/dL (ref >39.00)
LDL Cholesterol: 89 mg/dL (ref 0–99)
NonHDL: 97.34
TRIGLYCERIDES: 44 mg/dL (ref 0.0–149.0)
Total CHOL/HDL Ratio: 3
VLDL: 8.8 mg/dL (ref 0.0–40.0)

## 2017-06-18 LAB — CBC
HEMATOCRIT: 41.1 % (ref 39.0–52.0)
HEMOGLOBIN: 13.8 g/dL (ref 13.0–17.0)
MCHC: 33.5 g/dL (ref 30.0–36.0)
MCV: 91.2 fl (ref 78.0–100.0)
PLATELETS: 240 10*3/uL (ref 150.0–400.0)
RBC: 4.51 Mil/uL (ref 4.22–5.81)
RDW: 14.6 % (ref 11.5–15.5)
WBC: 7.2 10*3/uL (ref 4.0–10.5)

## 2017-06-18 MED ORDER — ESOMEPRAZOLE MAGNESIUM 20 MG PO CPDR: 20.0000 mg | DELAYED_RELEASE_CAPSULE | Freq: Every day | ORAL | 3 refills | Status: DC

## 2017-06-18 NOTE — Progress Notes (Signed)
Your CBC was normal (blood counts, infection fighting cells, platelets). Your CMET was largely normal (kidney, liver, and electrolytes, blood sugar).  Your blood sugar was still slightly high fasting suggesting an increased risk of diabetes.  With that being said it is improved significantly and is down 12 points.  Continue your healthy lifestyle changes/efforts Your cholesterol Has improved from a year ago and is now at goal with bad cholesterol under 100 at 89

## 2017-06-18 NOTE — Assessment & Plan Note (Signed)
Hypertension remains controlled on amlodipine-valsartan 5-160 mg. With his weight loss- we discussed trial of half tablet of amlodipine- valsartan 5-160mg . Recheck in 4-8 weeks

## 2017-06-18 NOTE — Patient Instructions (Addendum)
With his weight loss- we discussed trial of half tablet of amlodipine- valsartan 5-160mg . Recheck in 4-8 weeks  Also trial nexium 20mg .   If bad cholesterol under 70 we will cut your cholesterol medicine in half as well  Great job sustaining your weight loss- it is not often that we get the opportunity to reduce medicines!  Thanks for giving [place] this chance!

## 2017-06-18 NOTE — Assessment & Plan Note (Signed)
Hopeful CBG has improved with weight loss.  If not we will get an A1c next visit

## 2017-06-18 NOTE — Progress Notes (Signed)
Phone: [phone]  Subjective:  Patient presents today for their annual physical. Chief complaint-noted.   See problem oriented charting- ROS- full  review of systems was completed and negative except for: rectal bleeding  The following were reviewed and entered/updated in epic: Past Medical History:  Diagnosis Date  . Allergy    seasonal  . Arthritis   . Cancer ([place]) 10/2016   Prostate cancer=MRI,BX's done  . GERD (gastroesophageal reflux disease)   . Hyperlipidemia   . Hypertension   . Sleep apnea    c pap   Patient Active Problem List   Diagnosis Date Noted  . Hyperglycemia 05/12/2014    Priority: Medium  . Obstructive sleep apnea 08/05/2013    Priority: Medium  . Obesity 03/02/2009    Priority: Medium  . Hyperlipidemia 02/23/2007    Priority: Medium  . Essential hypertension 02/23/2007    Priority: Medium  . Bursitis of left shoulder 07/21/2014    Priority: Low  . Anterior thigh numbness 07/21/2014    Priority: Low  . Tear of MCL (medial collateral ligament) of knee 06/09/2014    Priority: Low  . Personal history of adenomatous colonic polyps 01/07/2012    Priority: Low  . Allergic rhinitis 02/23/2007    Priority: Low  . GERD 02/23/2007    Priority: Low  . Prostate cancer ([place]) 12/19/2016  . Acute medial meniscal tear 06/09/2014  . Displacement of lumbar intervertebral disc without myelopathy 04/19/2013   Past Surgical History:  Procedure Laterality Date  . BACK SURGERY  2015  . COLONOSCOPY    . KNEE ARTHROSCOPY Right   . other  1971   tonsilectomy  . PROSTATE BIOPSY  10/2016    Family History  Problem Relation Age of Onset  . Lymphoma Mother        stage 3  . Cancer Mother        eventually leukemia  . Diabetes Mother   . Heart disease Father 44       first event and led to death  . Colon cancer Maternal Uncle   . Heart disease Paternal Uncle   . Cancer Sister        blood/spleen related  . Esophageal cancer Neg Hx   . Stomach cancer  Neg Hx     Medications- reviewed and updated Current Outpatient Medications  Medication Sig Dispense Refill  . amLODipine-valsartan (EXFORGE) 5-160 MG tablet TAKE 1 TABLET BY MOUTH DAILY 90 tablet 1  . aspirin 81 MG tablet Take 81 mg by mouth daily.      Marland Kitchen atorvastatin (LIPITOR) 20 MG tablet TAKE 1 TABLET BY MOUTH EVERY DAY AT 6 PM 30 tablet 0  . bisacodyl (DULCOLAX) 5 MG EC tablet Take 5 mg by mouth once.    . desloratadine (CLARINEX) 5 MG tablet TAKE 1 TABLET(5 MG) BY MOUTH DAILY 90 tablet 1  . esomeprazole (NEXIUM) 20 MG capsule Take 1 capsule (20 mg total) by mouth daily. 90 capsule 3  . mometasone (NASONEX) 50 MCG/ACT nasal spray Place 2 sprays at bedtime into the nose. 17 g 5  . Multiple Vitamins-Minerals (MULTIVITAMIN WITH MINERALS) tablet Take 1 tablet by mouth daily.    Marland Kitchen PENNSAID 2 % SOLN Apply 1.5 application topically 2 (two) times daily.  2  . polyethylene glycol powder (MIRALAX) powder Take 1 Container by mouth once.    . vitamin E 400 UNIT capsule Take 400 Units by mouth 2 (two) times daily.     Marland Kitchen XIIDRA 5 % SOLN  Place 1 drop into both eyes 2 (two) times daily.  4   No current facility-administered medications for this visit.     Allergies-reviewed and updated No Known Allergies  Social History   Social History Narrative   Married 1990. Wife with bile duct cancer misdiagnosed in 2008, recurred 2016, < 1 year life expectancy. 1 daughter.       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       Hobbies: golf, sports, tv          Objective: BP 120/78 (BP Location: Left Arm, Patient Position: Sitting, Cuff Size: Large)   Pulse 76   Temp 98.1 F (36.7 C)   Ht 6' (1.829 m)   Wt 229 lb (103.9 kg)   SpO2 94%   BMI 31.06 kg/m  Gen: NAD, resting comfortably HEENT: Mucous membranes are moist. Oropharynx normal Neck: no thyromegaly CV: RRR no murmurs rubs or gallops Lungs: CTAB no crackles, wheeze, rhonchi Abdomen: soft/nontender/nondistended/normal bowel  sounds. No rebound or guarding.  Obese but much improved Ext: no edema Skin: warm, dry Neuro: grossly normal, moves all extremities, PERRLA Rectal: 2 hemorrhoid tags, no fissure  Assessment/Plan:  62 y.o. male presenting for annual physical.  Health Maintenance counseling: 1. Anticipatory guidance: Patient counseled regarding regular dental exams -q6 months, eye exams -yearly, wearing seatbelts.  2. Risk factor reduction:  Advised patient of need for regular exercise and diet rich and fruits and vegetables to reduce risk of heart attack and stroke.  In 2016 his weight was nearly 276. From nadir he is up 6 lbs. Exercising most days- rode 12 miles last weekend. He has been traveling more with work and that has been harder for him to eat well  Wt Readings from Last 3 Encounters:  06/18/17 229 lb (103.9 kg)  06/12/17 227 lb (103 kg)  12/19/16 225 lb 12.8 oz (102.4 kg)  3. Immunizations/screenings/ancillary studies-discussed Shingrix option- we will hold off until either next physical or next visit  Immunization History  Administered Date(s) Administered  . Influenza Split 12/14/2011, 03/13/2013  . Influenza Whole 12/11/2009  . Influenza,inj,Quad PF,6+ Mos 12/02/2013, 12/08/2014, 12/14/2015, 11/06/2016  . Td 02/10/2006  . Tdap 06/13/2016  4. Prostate cancer monitoring- patient has prostate cancer.  He has been monitored at [facility].  He was diagnosed at [facility].  Gleason 3+3= 6 on 2 of 12 biopsies.  He is under active surveillance at present. Has June biopsy planned with [facility]. Just had MRI with them.  5. Colon cancer screening - patient is due for colonoscopy and is already scheduled with Dr. Carlean Purl.  Last colonoscopy 01/07/2012 with 5-year repeat planned. 6. Skin cancer screening-has a dermatologist- last seen sept 2017.  Advised regular sunscreen use. Denies worrisome, changing, or new skin lesions.    Status of chronic or acute concerns  He is healing well from his  penile implant  Rectal bleeding- has had hemorrhoids in the past as well as fissures. Notes bright red in bowl small amount- perhaps twice a month for 6 months.  Hemorrhoid tags on exam.  Suspect has had some hemorrhoids with his bike riding.  Luckily has upcoming colonoscopy  Essential hypertension Hypertension remains controlled on amlodipine-valsartan 5-160 mg. With his weight loss- we discussed trial of half tablet of amlodipine- valsartan 5-160mg . Recheck in 4-8 weeks  Hyperglycemia Hopeful CBG has improved with weight loss.  If not we will get an A1c next visit  Hyperlipidemia Hyperlipidemia- remains on atorvastatin 20 mg.  Last visit  he lost 12 pounds.  This visit he has gained just a few.  Hopefully with improvement in weight-we will see improvement in labs today. Will update today- may consider reducing dose if LDL below 70.   GERD GERD- nexium 40mg  (gets breakthrough if misses dose)--> we will trial 20mg   Future Appointments  Date Time Provider [place]  06/26/2017 10:30 AM Gatha Mayer, MD [facility]  12/15/2017  9:00 AM Deneise Lever, MD [facility] None    4 to 8-week follow-up Lab/Order associations: Hyperlipidemia, unspecified hyperlipidemia type - Plan: CBC, Comprehensive metabolic panel, Lipid panel, Lipid panel, Comprehensive metabolic panel, CBC  Meds ordered this encounter  Medications  . esomeprazole (NEXIUM) 20 MG capsule    Sig: Take 1 capsule (20 mg total) by mouth daily.    Dispense:  90 capsule    Refill:  3    Return precautions advised.  Garret Reddish, MD

## 2017-06-18 NOTE — Assessment & Plan Note (Signed)
GERD- nexium 40mg  (gets breakthrough if misses dose)--> we will trial 20mg 

## 2017-06-18 NOTE — Assessment & Plan Note (Signed)
Hyperlipidemia- remains on atorvastatin 20 mg.  Last visit he lost 12 pounds.  This visit he has gained just a few.  Hopefully with improvement in weight-we will see improvement in labs today. Will update today- may consider reducing dose if LDL below 70.

## 2017-06-19 ENCOUNTER — Telehealth: Payer: Self-pay | Admitting: Family Medicine

## 2017-06-19 ENCOUNTER — Telehealth: Payer: Self-pay

## 2017-06-19 NOTE — Telephone Encounter (Signed)
Left message for pt to call back with lab results. See result note

## 2017-06-19 NOTE — Telephone Encounter (Signed)
Pt calling back for lab results.   Copied from [place] [id number]. Topic: Quick Communication - Lab Results >> Jun 19, 2017  2:12 PM Marian Sorrow, LPN wrote: Called patient to inform them of 06/18/2017 lab results. When patient returns call, triage nurse may disclose results.

## 2017-06-19 NOTE — Telephone Encounter (Signed)
Called patient and seen he had viewed his lab results. Patient has some follow up questions. He would like a further explanation of the Total CHOL/HDL Ratio and if he should cut his statin in half now. He also wants know how can he get his blood sugar lower. He stated he is already on a good diet for the sugar. Other wise Patient verbalized understanding. He would like a phone call from Dr. Yong Channel but I wasn't sure if you could give him a call or not. I will give him a call with your answers to his questions.

## 2017-06-26 ENCOUNTER — Ambulatory Visit (AMBULATORY_SURGERY_CENTER): Payer: BLUE CROSS/BLUE SHIELD | Admitting: Internal Medicine

## 2017-06-26 ENCOUNTER — Other Ambulatory Visit: Payer: Self-pay

## 2017-06-26 ENCOUNTER — Encounter: Payer: Self-pay | Admitting: Internal Medicine

## 2017-06-26 VITALS — BP 122/82 | HR 67 | Temp 99.3°F | Resp 11 | Ht 72.0 in | Wt 229.0 lb

## 2017-06-26 DIAGNOSIS — Z860100 Personal history of colon polyps, unspecified: Secondary | ICD-10-CM

## 2017-06-26 DIAGNOSIS — D122 Benign neoplasm of ascending colon: Secondary | ICD-10-CM

## 2017-06-26 DIAGNOSIS — Z1211 Encounter for screening for malignant neoplasm of colon: Secondary | ICD-10-CM | POA: Diagnosis not present

## 2017-06-26 MED ORDER — SODIUM CHLORIDE 0.9 % IV SOLN: 500.0000 mL | Freq: Once | INTRAVENOUS | Status: DC

## 2017-06-26 NOTE — Progress Notes (Signed)
Called to room to assist during endoscopic procedure.  Patient ID and intended procedure confirmed with present staff. Received instructions for my participation in the procedure from the performing physician.  

## 2017-06-26 NOTE — Patient Instructions (Addendum)
I found and removed one tiny polyp.  I will let you know pathology results and when to have another routine colonoscopy by mail and/or My Chart. Will be 5 years at least I think.  Have a great trip!  I appreciate the opportunity to care for you. Gatha Mayer, MD, FACG  YOU HAD AN ENDOSCOPIC PROCEDURE TODAY AT [facility]:   Refer to the procedure report that was given to you for any specific questions about what was found during the examination.  If the procedure report does not answer your questions, please call your gastroenterologist to clarify.  If you requested that your care partner not be given the details of your procedure findings, then the procedure report has been included in a sealed envelope for you to review at your convenience later.  YOU SHOULD EXPECT: Some feelings of bloating in the abdomen. Passage of more gas than usual.  Walking can help get rid of the air that was put into your GI tract during the procedure and reduce the bloating. If you had a lower endoscopy (such as a colonoscopy or flexible sigmoidoscopy) you may notice spotting of blood in your stool or on the toilet paper. If you underwent a bowel prep for your procedure, you may not have a normal bowel movement for a few days.  Please Note:  You might notice some irritation and congestion in your nose or some drainage.  This is from the oxygen used during your procedure.  There is no need for concern and it should clear up in a day or so.  SYMPTOMS TO REPORT IMMEDIATELY:   Following lower endoscopy (colonoscopy or flexible sigmoidoscopy):  Excessive amounts of blood in the stool  Significant tenderness or worsening of abdominal pains  Swelling of the abdomen that is new, acute  Fever of 100F or higher  For urgent or emergent issues, a gastroenterologist can be reached at any hour by calling [phone].   DIET:  We do recommend a small meal at first, but then you may proceed to  your regular diet.  Drink plenty of fluids but you should avoid alcoholic beverages for 24 hours.  MEDICATIONS: Continue present medications.  Please see handouts given to you by by your recovery nurse.  ACTIVITY:  You should plan to take it easy for the rest of today and you should NOT DRIVE or use heavy machinery until tomorrow (because of the sedation medicines used during the test).    FOLLOW UP: Our staff will call the number listed on your records the next business day following your procedure to check on you and address any questions or concerns that you may have regarding the information given to you following your procedure. If we do not reach you, we will leave a message.  However, if you are feeling well and you are not experiencing any problems, there is no need to return our call.  We will assume that you have returned to your regular daily activities without incident.  If any biopsies were taken you will be contacted by phone or by letter within the next 1-3 weeks.  Please call us at [phone] if you have not heard about the biopsies in 3 weeks.   Thank you for allowing [place] to provide for your healthcare needs today.  SIGNATURES/CONFIDENTIALITY: You and/or your care partner have signed paperwork which will be entered into your electronic medical record.  These signatures attest to the fact that that the information  above on your After Visit Summary has been reviewed and is understood.  Full responsibility of the confidentiality of this discharge information lies with you and/or your care-partner. 

## 2017-06-26 NOTE — Progress Notes (Signed)
Report to RN, VSS, adequate respirations noted, no c/o pain or discomfort 

## 2017-06-26 NOTE — Progress Notes (Signed)
Pt's states no medical or surgical changes since previsit or office visit. 

## 2017-06-26 NOTE — Op Note (Signed)
Billy Todd Patient Name: Billy Todd Procedure Date: 06/26/2017 10:38 AM MRN: [id number] Endoscopist: Gatha Mayer , MD Age: 62 Referring MD:  Date of Birth: [birth year] Gender: Male Account #: [id number] Procedure:                Colonoscopy Indications:              High risk colon cancer surveillance: Personal                            history of sessile serrated colon polyp (less than                            10 mm in size) with no dysplasia, Last colonoscopy:                            November 2013 Medicines:                Propofol per Anesthesia, Monitored Anesthesia Care Procedure:                Pre-Anesthesia Assessment:                           - Prior to the procedure, a History and Physical                            was performed, and patient medications and                            allergies were reviewed. The patient's tolerance of                            previous anesthesia was also reviewed. The risks                            and benefits of the procedure and the sedation                            options and risks were discussed with the patient.                            All questions were answered, and informed consent                            was obtained. Prior Anticoagulants: The patient has                            taken no previous anticoagulant or antiplatelet                            agents. ASA Grade Assessment: II - A patient with                            mild systemic disease. After reviewing the risks  and benefits, the patient was deemed in                            satisfactory condition to undergo the procedure.                           After obtaining informed consent, the colonoscope                            was passed under direct vision. Throughout the                            procedure, the patient's blood pressure, pulse, and                            oxygen saturations were  monitored continuously. The                            Colonoscope was introduced through the anus and                            advanced to the the cecum, identified by                            appendiceal orifice and ileocecal valve. The                            colonoscopy was performed without difficulty. The                            patient tolerated the procedure well. The quality                            of the bowel preparation was excellent. The                            ileocecal valve, appendiceal orifice, and rectum                            were photographed. The bowel preparation used was                            Miralax. Scope In: 10:52:36 AM Scope Out: 02:40:97 AM Scope Withdrawal Time: 0 hours 12 minutes 29 seconds  Total Procedure Duration: 0 hours 14 minutes 41 seconds  Findings:                 The perianal and digital rectal examinations were                            normal.                           A diminutive polyp was found in the ascending  colon. The polyp was sessile. The polyp was removed                            with a cold snare. Resection and retrieval were                            complete. Verification of patient identification                            for the specimen was done. Estimated blood loss was                            minimal.                           Multiple diverticula were found in the sigmoid                            colon.                           The exam was otherwise without abnormality on                            direct and retroflexion views. Complications:            No immediate complications. Estimated Blood Loss:     Estimated blood loss was minimal. Impression:               - One diminutive polyp in the ascending colon,                            removed with a cold snare. Resected and retrieved.                           - Personal history of colonic polyps. serrated                             adenomas x 3 2010 then no precancerous polyps 2013 Recommendation:           - Patient has a contact number available for                            emergencies. The signs and symptoms of potential                            delayed complications were discussed with the                            patient. Return to normal activities tomorrow.                            Written discharge instructions were provided to the  patient.                           - Resume previous diet.                           - Continue present medications.                           - Repeat colonoscopy is recommended for                            surveillance. The colonoscopy date will be                            determined after pathology results from today's                            exam become available for review. Gatha Mayer, MD 06/26/2017 11:19:48 AM This report has been signed electronically.

## 2017-06-29 ENCOUNTER — Telehealth: Payer: Self-pay

## 2017-06-29 NOTE — Telephone Encounter (Signed)
  Follow up Call-  Call back number 06/26/2017  Post procedure Call Back phone  # [phone]   Permission to leave phone message Yes  Some recent data might be hidden     Patient questions:  Do you have a fever, pain , or abdominal swelling? No. Pain Score  0 *  Have you tolerated food without any problems? Yes.    Have you been able to return to your normal activities? Yes.    Do you have any questions about your discharge instructions: Diet   No. Medications  No. Follow up visit  No.  Do you have questions or concerns about your Care? No.  Actions: * If pain score is 4 or above: No action needed, pain <4.

## 2017-06-30 DIAGNOSIS — C61 Malignant neoplasm of prostate: Secondary | ICD-10-CM | POA: Diagnosis not present

## 2017-07-02 DIAGNOSIS — C61 Malignant neoplasm of prostate: Secondary | ICD-10-CM | POA: Diagnosis not present

## 2017-07-07 ENCOUNTER — Encounter: Payer: Self-pay | Admitting: Internal Medicine

## 2017-07-07 NOTE — Progress Notes (Signed)
Diminutive adenoma recall 2024 colonoscopy  My Chart letter

## 2017-07-09 DIAGNOSIS — C61 Malignant neoplasm of prostate: Secondary | ICD-10-CM | POA: Diagnosis not present

## 2017-07-09 DIAGNOSIS — Z79899 Other long term (current) drug therapy: Secondary | ICD-10-CM | POA: Diagnosis not present

## 2017-07-09 DIAGNOSIS — Z6831 Body mass index (BMI) 31.0-31.9, adult: Secondary | ICD-10-CM | POA: Diagnosis not present

## 2017-07-09 DIAGNOSIS — Z960 Presence of urogenital implants: Secondary | ICD-10-CM | POA: Diagnosis not present

## 2017-07-12 ENCOUNTER — Other Ambulatory Visit: Payer: Self-pay | Admitting: Family Medicine

## 2017-07-22 ENCOUNTER — Other Ambulatory Visit: Payer: Self-pay | Admitting: Family Medicine

## 2017-07-29 ENCOUNTER — Encounter: Payer: Self-pay | Admitting: Family Medicine

## 2017-07-31 ENCOUNTER — Ambulatory Visit (INDEPENDENT_AMBULATORY_CARE_PROVIDER_SITE_OTHER): Payer: BLUE CROSS/BLUE SHIELD | Admitting: Family Medicine

## 2017-07-31 ENCOUNTER — Encounter: Payer: Self-pay | Admitting: Family Medicine

## 2017-07-31 DIAGNOSIS — I10 Essential (primary) hypertension: Secondary | ICD-10-CM

## 2017-07-31 DIAGNOSIS — E785 Hyperlipidemia, unspecified: Secondary | ICD-10-CM | POA: Diagnosis not present

## 2017-07-31 DIAGNOSIS — K219 Gastro-esophageal reflux disease without esophagitis: Secondary | ICD-10-CM | POA: Diagnosis not present

## 2017-07-31 MED ORDER — TELMISARTAN 20 MG PO TABS: 20.0000 mg | ORAL_TABLET | Freq: Every day | ORAL | 5 refills | Status: DC

## 2017-07-31 NOTE — Progress Notes (Addendum)
Subjective:  Billy Todd is a 62 y.o. year old very pleasant male patient who presents for/with See problem oriented charting ROS- No chest pain or shortness of breath. No headache or blurry vision.    Past Medical History-  Patient Active Problem List   Diagnosis Date Noted  . Hyperglycemia 05/12/2014    Priority: Medium  . Obstructive sleep apnea 08/05/2013    Priority: Medium  . Obesity 03/02/2009    Priority: Medium  . Hyperlipidemia 02/23/2007    Priority: Medium  . Essential hypertension 02/23/2007    Priority: Medium  . Bursitis of left shoulder 07/21/2014    Priority: Low  . Anterior thigh numbness 07/21/2014    Priority: Low  . Tear of MCL (medial collateral ligament) of knee 06/09/2014    Priority: Low  . Personal history of adenomatous colonic polyps 01/07/2012    Priority: Low  . Allergic rhinitis 02/23/2007    Priority: Low  . GERD 02/23/2007    Priority: Low  . Prostate cancer ([place]) 12/19/2016  . Acute medial meniscal tear 06/09/2014  . Displacement of lumbar intervertebral disc without myelopathy 04/19/2013    Medications- reviewed and updated Current Outpatient Medications  Medication Sig Dispense Refill  . aspirin 81 MG tablet Take 81 mg by mouth daily.      Marland Kitchen atorvastatin (LIPITOR) 20 MG tablet TAKE 1 TABLET BY MOUTH EVERY DAY AT 6 PM 30 tablet 5  . desloratadine (CLARINEX) 5 MG tablet TAKE 1 TABLET(5 MG) BY MOUTH DAILY 90 tablet 1  . esomeprazole (NEXIUM) 20 MG capsule Take 1 capsule (20 mg total) by mouth daily. 90 capsule 3  . mometasone (NASONEX) 50 MCG/ACT nasal spray SHAKE LIQUID AND USE 2 SPRAYS IN EACH NOSTRIL AT BEDTIME 17 g 2  . Multiple Vitamins-Minerals (MULTIVITAMIN WITH MINERALS) tablet Take 1 tablet by mouth daily.    Marland Kitchen PENNSAID 2 % SOLN Apply 1.5 application topically 2 (two) times daily.  2  . telmisartan (MICARDIS) 20 MG tablet Take 1 tablet (20 mg total) by mouth daily. 30 tablet 5  . vitamin E 400 UNIT capsule Take 400 Units by  mouth 2 (two) times daily.     Marland Kitchen XIIDRA 5 % SOLN Place 1 drop into both eyes 2 (two) times daily.  4   No current facility-administered medications for this visit.     Objective: BP 122/78 (BP Location: Left Arm, Patient Position: Sitting, Cuff Size: Normal)   Pulse 61   Temp 98.9 F (37.2 C) (Oral)   Ht 6' (1.829 m)   Wt 230 lb (104.3 kg)   SpO2 97%   BMI 31.19 kg/m  Gen: NAD, resting comfortably CV: RRR no murmurs rubs or gallops Lungs: CTAB no crackles, wheeze, rhonchi Abdomen: soft/nontender/nondistended/normal bowel sounds.  Ext: trace edema Skin: warm, dry  Assessment/Plan:  Essential hypertension S: controlled on valsartan- amlodipine 160-5mg . We opted to trial half dose at last visit and follow up 4-8 weeks. Exercising still. Continues healthier diet- has maintained his weight loss.  BP Readings from Last 3 Encounters:  07/31/17 122/78  06/26/17 122/82  06/18/17 120/78  A/P: he wants to stop valsartan due to possible recall. He wants to consider alternate than amlodipine given ankle swelling at times. We will trial telmisartan at low dose and follow up 4-8 weeks.   GERD S: last visit we trialed down to 20mg  of nexium. If he misses a dose certainly gets breakthrough quickly A/P: continue current medicine- given breakthrough with missing doses hold off  on further reduction  Hyperlipidemia S: well controlled on atorvastatin 20mg . He asks about stopping  A/P: given LDL at 89 (good control) advised him to continue current dose of medication. Weight loss has been excellent but looks like he needs that in addition to statin for healthy #s.     Recheck BP 4-8 weeks Future Appointments  Date Time Provider [place]  09/18/2017  2:15 PM Marin Olp, MD [facility]  12/15/2017  9:00 AM Deneise Lever, MD [facility] None    Meds ordered this encounter  Medications  . telmisartan (MICARDIS) 20 MG tablet    Sig: Take 1 tablet (20 mg total) by mouth  daily.    Dispense:  30 tablet    Refill:  5    Return precautions advised.  Garret Reddish, MD

## 2017-07-31 NOTE — Patient Instructions (Addendum)
Stop combo blood pressure medicine amlodipine valsartan  We will trial Telmisartan 20mg  and follow up 4-8 weeks for recheck

## 2017-07-31 NOTE — Assessment & Plan Note (Signed)
S: controlled on valsartan- amlodipine 160-5mg . We opted to trial half dose at last visit and follow up 4-8 weeks. Exercising still. Continues healthier diet- has maintained his weight loss.  BP Readings from Last 3 Encounters:  07/31/17 122/78  06/26/17 122/82  06/18/17 120/78  A/P: he wants to stop valsartan due to possible recall. He wants to consider alternate than amlodipine given ankle swelling at times. We will trial telmisartan at low dose and follow up 4-8 weeks.

## 2017-07-31 NOTE — Assessment & Plan Note (Signed)
S: well controlled on atorvastatin 20mg . He asks about stopping  A/P: given LDL at 89 (good control) advised him to continue current dose of medication. Weight loss has been excellent but looks like he needs that in addition to statin for healthy #s.

## 2017-07-31 NOTE — Assessment & Plan Note (Signed)
S: last visit we trialed down to 20mg  of nexium. If he misses a dose certainly gets breakthrough quickly A/P: continue current medicine- given breakthrough with missing doses hold off on further reduction

## 2017-08-11 ENCOUNTER — Telehealth: Payer: Self-pay | Admitting: Family Medicine

## 2017-08-11 NOTE — Telephone Encounter (Signed)
Can send in telmisartan 40mg  tablets- take 1/2 tablet daily #45 with 3 refills or #15 with 11 refills.

## 2017-08-11 NOTE — Telephone Encounter (Signed)
Please see message and advise 

## 2017-08-11 NOTE — Telephone Encounter (Signed)
See note

## 2017-08-11 NOTE — Telephone Encounter (Signed)
Copied from [place] [id number]. Topic: Quick Communication - Rx Refill/Question >> Aug 11, 2017  2:24 PM Dawoud, Fraser Din wrote: Medication:telmisartan (MICARDIS) 20 MG tablet Rachael from [facility] called and stated that they are still not able to get 20 mg medication in and wanted to know if someone could call something different in or change the prescription to 40 mg which they have and the pt could take 1/2 a day. Please advise Cb# [phone]  Preferred Pharmacy (with phone number or street name): [facility] - [place], [place] AT [facility] [address] [phone] (Phone) [phone] (Fax)

## 2017-08-12 MED ORDER — TELMISARTAN 40 MG PO TABS: 20.0000 mg | ORAL_TABLET | Freq: Every day | ORAL | 3 refills | Status: DC

## 2017-08-12 NOTE — Telephone Encounter (Signed)
Rx sent to pharmacy per Dr. Ansel Bong request.

## 2017-08-24 DIAGNOSIS — C61 Malignant neoplasm of prostate: Secondary | ICD-10-CM | POA: Diagnosis not present

## 2017-09-18 ENCOUNTER — Ambulatory Visit (INDEPENDENT_AMBULATORY_CARE_PROVIDER_SITE_OTHER): Payer: BLUE CROSS/BLUE SHIELD | Admitting: Family Medicine

## 2017-09-18 ENCOUNTER — Encounter: Payer: Self-pay | Admitting: Family Medicine

## 2017-09-18 VITALS — BP 122/68 | HR 54 | Temp 97.7°F | Ht 72.0 in | Wt 229.0 lb

## 2017-09-18 DIAGNOSIS — J301 Allergic rhinitis due to pollen: Secondary | ICD-10-CM

## 2017-09-18 DIAGNOSIS — I10 Essential (primary) hypertension: Secondary | ICD-10-CM | POA: Diagnosis not present

## 2017-09-18 NOTE — Assessment & Plan Note (Signed)
S: 2-3 weeks of some cough, congestion,runny nose. No sinus pressure. Mainly clear. Sticking with claritin. Also on nasonex. Doesn't feel bad/run down. Feels best he has felt in a few weeks.  A/P: sounds allergic rhinitis based. Lungs clear. Discussed prednisone but could cause weight gain and increase BP. Discussed neti pot- he thinks he will stay steady on regimen since improving but will let us know if not improving

## 2017-09-18 NOTE — Patient Instructions (Addendum)
Health Maintenance Due  Topic Date Due  . INFLUENZA VACCINE - Please schedule this sometime in the fall 09/10/2017    Let's stop the telmisartan and follow up in 1-2 months

## 2017-09-18 NOTE — Progress Notes (Signed)
Subjective:  Billy Todd is a 62 y.o. year old very pleasant male patient who presents for/with See problem oriented charting ROS- No chest pain or shortness of breath. No headache or blurry vision.    Past Medical History-  Patient Active Problem List   Diagnosis Date Noted  . Hyperglycemia 05/12/2014    Priority: Medium  . Obstructive sleep apnea 08/05/2013    Priority: Medium  . Obesity 03/02/2009    Priority: Medium  . Hyperlipidemia 02/23/2007    Priority: Medium  . Essential hypertension 02/23/2007    Priority: Medium  . Bursitis of left shoulder 07/21/2014    Priority: Low  . Anterior thigh numbness 07/21/2014    Priority: Low  . Tear of MCL (medial collateral ligament) of knee 06/09/2014    Priority: Low  . Personal history of adenomatous colonic polyps 01/07/2012    Priority: Low  . Allergic rhinitis 02/23/2007    Priority: Low  . GERD 02/23/2007    Priority: Low  . Prostate cancer ([place]) 12/19/2016  . Acute medial meniscal tear 06/09/2014  . Displacement of lumbar intervertebral disc without myelopathy 04/19/2013    Medications- reviewed and updated Current Outpatient Medications  Medication Sig Dispense Refill  . aspirin 81 MG tablet Take 81 mg by mouth daily.      Marland Kitchen atorvastatin (LIPITOR) 20 MG tablet TAKE 1 TABLET BY MOUTH EVERY DAY AT 6 PM 30 tablet 5  . desloratadine (CLARINEX) 5 MG tablet TAKE 1 TABLET(5 MG) BY MOUTH DAILY 90 tablet 1  . esomeprazole (NEXIUM) 20 MG capsule Take 1 capsule (20 mg total) by mouth daily. 90 capsule 3  . mometasone (NASONEX) 50 MCG/ACT nasal spray SHAKE LIQUID AND USE 2 SPRAYS IN EACH NOSTRIL AT BEDTIME 17 g 2  . Multiple Vitamins-Minerals (MULTIVITAMIN WITH MINERALS) tablet Take 1 tablet by mouth daily.    Marland Kitchen PENNSAID 2 % SOLN Apply 1.5 application topically 2 (two) times daily.  2  . telmisartan (MICARDIS) 40 MG tablet Take 0.5 tablets (20 mg total) by mouth daily. 45 tablet 3  . vitamin E 400 UNIT capsule Take 400 Units  by mouth 2 (two) times daily.     Marland Kitchen XIIDRA 5 % SOLN Place 1 drop into both eyes 2 (two) times daily.  4   No current facility-administered medications for this visit.     Objective: BP 122/68 (BP Location: Left Arm, Patient Position: Sitting, Cuff Size: Large)   Pulse (!) 54   Temp 97.7 F (36.5 C) (Oral)   Ht 6' (1.829 m)   Wt 229 lb (103.9 kg)   SpO2 97%   BMI 31.06 kg/m  Gen: NAD, resting comfortably CV: RRR no murmurs rubs or gallops Lungs: CTAB no crackles, wheeze, rhonchi Abdomen: soft/nontender/nondistended/normal bowel sounds. No rebound or guarding.  Ext: trace edema Skin: warm, dry  Assessment/Plan:  His goal is to be under 200.   Essential hypertension S: controlled on  telmisartan 20mg . No checks at hom BP Readings from Last 3 Encounters:  09/18/17 122/68  07/31/17 122/78  06/26/17 122/82  A/P: We discussed blood pressure goal of <140/90. Let's stop the telmisartan and follow up in 1-2 months. Discussed could check at pharmacy to make sure at goal  Allergic rhinitis S: 2-3 weeks of some cough, congestion,runny nose. No sinus pressure. Mainly clear. Sticking with claritin. Also on nasonex. Doesn't feel bad/run down. Feels best he has felt in a few weeks.  A/P: sounds allergic rhinitis based. Lungs clear. Discussed prednisone but  could cause weight gain and increase BP. Discussed neti pot- he thinks he will stay steady on regimen since improving but will let us know if not improving  Future Appointments  Date Time Provider [place]  11/06/2017  2:15 PM Marin Olp, MD [facility]  12/15/2017  9:00 AM Deneise Lever, MD [facility] None   Return precautions advised.  Garret Reddish, MD

## 2017-09-18 NOTE — Assessment & Plan Note (Signed)
S: controlled on  telmisartan 20mg . No checks at hom BP Readings from Last 3 Encounters:  09/18/17 122/68  07/31/17 122/78  06/26/17 122/82  A/P: We discussed blood pressure goal of <140/90. Let's stop the telmisartan and follow up in 1-2 months. Discussed could check at pharmacy to make sure at goal

## 2017-11-06 ENCOUNTER — Ambulatory Visit: Payer: BLUE CROSS/BLUE SHIELD | Admitting: Family Medicine

## 2017-12-01 ENCOUNTER — Ambulatory Visit: Payer: BLUE CROSS/BLUE SHIELD | Admitting: Family Medicine

## 2017-12-08 ENCOUNTER — Ambulatory Visit: Payer: BLUE CROSS/BLUE SHIELD | Admitting: Family Medicine

## 2017-12-11 ENCOUNTER — Encounter: Payer: Self-pay | Admitting: Family Medicine

## 2017-12-11 ENCOUNTER — Ambulatory Visit (INDEPENDENT_AMBULATORY_CARE_PROVIDER_SITE_OTHER): Payer: BLUE CROSS/BLUE SHIELD | Admitting: Family Medicine

## 2017-12-11 VITALS — BP 120/66 | HR 70 | Temp 98.3°F | Ht 72.0 in | Wt 238.8 lb

## 2017-12-11 DIAGNOSIS — K219 Gastro-esophageal reflux disease without esophagitis: Secondary | ICD-10-CM

## 2017-12-11 DIAGNOSIS — Z23 Encounter for immunization: Secondary | ICD-10-CM

## 2017-12-11 DIAGNOSIS — E785 Hyperlipidemia, unspecified: Secondary | ICD-10-CM

## 2017-12-11 DIAGNOSIS — I10 Essential (primary) hypertension: Secondary | ICD-10-CM | POA: Diagnosis not present

## 2017-12-11 NOTE — Assessment & Plan Note (Signed)
S:  controlled on atorvastatin 20mg  Lab Results  Component Value Date   CHOL 160 06/18/2017   HDL 63.00 06/18/2017   LDLCALC 89 06/18/2017   LDLDIRECT 113.0 12/14/2015   TRIG 44.0 06/18/2017   CHOLHDL 3 06/18/2017   A/P: continue current medication - would love to decrease rx- but LDL is still close to 100 so will leave alone unless significant drop next years labs

## 2017-12-11 NOTE — Patient Instructions (Addendum)
You wanted to get back on healthier daytime food choices. Less grab and go and more preparation.   Keep up the great job with exercise.   Continue off telmisartan.   Try to cut down on caffeine, peppermint, chocolate to see if that helps with reflux   06/19/2018 or later for physical- can go ahead and schedule if you want

## 2017-12-11 NOTE — Progress Notes (Signed)
Subjective:  Billy Todd is a 62 y.o. year old very pleasant male patient who presents for/with See problem oriented charting ROS- No chest pain or shortness of breath. No headache or blurry vision.    Past Medical History-  Patient Active Problem List   Diagnosis Date Noted  . Hyperglycemia 05/12/2014    Priority: Medium  . Obstructive sleep apnea 08/05/2013    Priority: Medium  . Obesity 03/02/2009    Priority: Medium  . Hyperlipidemia 02/23/2007    Priority: Medium  . Essential hypertension 02/23/2007    Priority: Medium  . Bursitis of left shoulder 07/21/2014    Priority: Low  . Anterior thigh numbness 07/21/2014    Priority: Low  . Tear of MCL (medial collateral ligament) of knee 06/09/2014    Priority: Low  . Personal history of adenomatous colonic polyps 01/07/2012    Priority: Low  . Allergic rhinitis 02/23/2007    Priority: Low  . GERD 02/23/2007    Priority: Low  . Prostate cancer ([place]) 12/19/2016  . Acute medial meniscal tear 06/09/2014  . Displacement of lumbar intervertebral disc without myelopathy 04/19/2013    Medications- reviewed and updated Current Outpatient Medications  Medication Sig Dispense Refill  . aspirin 81 MG tablet Take 81 mg by mouth daily.      Marland Kitchen atorvastatin (LIPITOR) 20 MG tablet TAKE 1 TABLET BY MOUTH EVERY DAY AT 6 PM 30 tablet 5  . desloratadine (CLARINEX) 5 MG tablet TAKE 1 TABLET(5 MG) BY MOUTH DAILY 90 tablet 1  . esomeprazole (NEXIUM) 20 MG capsule Take 1 capsule (20 mg total) by mouth daily. 90 capsule 3  . mometasone (NASONEX) 50 MCG/ACT nasal spray SHAKE LIQUID AND USE 2 SPRAYS IN EACH NOSTRIL AT BEDTIME 17 g 2  . Multiple Vitamins-Minerals (MULTIVITAMIN WITH MINERALS) tablet Take 1 tablet by mouth daily.    Marland Kitchen PENNSAID 2 % SOLN Apply 1.5 application topically 2 (two) times daily.  2  . vitamin E 400 UNIT capsule Take 400 Units by mouth 2 (two) times daily.     Marland Kitchen XIIDRA 5 % SOLN Place 1 drop into both eyes 2 (two) times  daily.  4   No current facility-administered medications for this visit.     Objective: BP 120/66 (BP Location: Left Arm, Patient Position: Sitting, Cuff Size: Large)   Pulse 70   Temp 98.3 F (36.8 C) (Oral)   Ht 6' (1.829 m)   Wt 238 lb 12.8 oz (108.3 kg)   SpO2 97%   BMI 32.39 kg/m  Gen: NAD, resting comfortably CV: RRR no murmurs rubs or gallops Lungs: CTAB no crackles, wheeze, rhonchi Abdomen: soft/nontender/nondistended Ext: no edema Skin: warm, dry  Assessment/Plan:  Essential hypertension S: controlled on no medication today. Home #s have been excellent typically 130/80 at pharmacy or at home checks. He has kept up exercise 5-6 days a week including yoga. Thinks not eating quite as well in daytime as back at work fulltime.   Weight has swung back up 9 lbs this visit. Peak weight 276 and at 238 now.  BP Readings from Last 3 Encounters:  12/11/17 120/66. 126/78 on my repeat.   09/18/17 122/68  07/31/17 122/78  A/P: We discussed blood pressure goal of <140/90. Continue off medicine- excellent control with lifestyle changes  Hyperlipidemia S:  controlled on atorvastatin 20mg  Lab Results  Component Value Date   CHOL 160 06/18/2017   HDL 63.00 06/18/2017   LDLCALC 89 06/18/2017   LDLDIRECT 113.0 12/14/2015  TRIG 44.0 06/18/2017   CHOLHDL 3 06/18/2017   A/P: continue current medication - would love to decrease rx- but LDL is still close to 100 so will leave alone unless significant drop next years labs  GERD S: on nexium 20 mg getting some breakthrough if misses a dose whereas on 40mg  even if missed dose didn't have issues. Doing fair amount of coffee, has dark chocolate after lunch, peppermint gum A/P: we discussed making some lifestyle adjustments on food/gum intake and trying to continue 20mg  dose  Future Appointments  Date Time Provider Page  12/15/2017  9:00 AM Deneise Lever, MD [facility] None   Return in about 28 weeks (around 06/25/2018)  for physical.  Lab/Order associations: Need for prophylactic vaccination and inoculation against influenza - Plan: Flu Vaccine QUAD 36+ mos IM  Essential hypertension  Hyperlipidemia, unspecified hyperlipidemia type  Gastroesophageal reflux disease without esophagitis  Return precautions advised.  Garret Reddish, MD

## 2017-12-11 NOTE — Assessment & Plan Note (Signed)
S: controlled on no medication today. Home #s have been excellent typically 130/80 at pharmacy or at home checks. He has kept up exercise 5-6 days a week including yoga. Thinks not eating quite as well in daytime as back at work fulltime.   Weight has swung back up 9 lbs this visit. Peak weight 276 and at 238 now.  BP Readings from Last 3 Encounters:  12/11/17 120/66. 126/78 on my repeat.   09/18/17 122/68  07/31/17 122/78  A/P: We discussed blood pressure goal of <140/90. Continue off medicine- excellent control with lifestyle changes

## 2017-12-11 NOTE — Assessment & Plan Note (Signed)
S: on nexium 20 mg getting some breakthrough if misses a dose whereas on 40mg  even if missed dose didn't have issues. Doing fair amount of coffee, has dark chocolate after lunch, peppermint gum A/P: we discussed making some lifestyle adjustments on food/gum intake and trying to continue 20mg  dose

## 2017-12-15 ENCOUNTER — Ambulatory Visit: Payer: BLUE CROSS/BLUE SHIELD | Admitting: Internal Medicine

## 2017-12-17 ENCOUNTER — Other Ambulatory Visit: Payer: Self-pay | Admitting: Family Medicine

## 2018-01-06 ENCOUNTER — Other Ambulatory Visit: Payer: Self-pay | Admitting: Family Medicine

## 2018-01-25 ENCOUNTER — Other Ambulatory Visit: Payer: Self-pay | Admitting: Family Medicine

## 2018-01-28 ENCOUNTER — Telehealth: Payer: Self-pay | Admitting: Internal Medicine

## 2018-01-28 MED ORDER — AMOXICILLIN-POT CLAVULANATE 875-125 MG PO TABS: 1.0000 | ORAL_TABLET | Freq: Two times a day (BID) | ORAL | 0 refills | Status: DC

## 2018-01-28 NOTE — Telephone Encounter (Signed)
Ph- reports sinus infection R. Has appointment in AM. Plan- augmentin sent to get started

## 2018-01-28 NOTE — Telephone Encounter (Signed)
Dr. Annamaria Boots please advise on any recommendations.  Pt has pending ov with you tomorrow,01/29/18. Thanks.  Current Outpatient Medications on File Prior to Visit  Medication Sig Dispense Refill  . aspirin 81 MG tablet Take 81 mg by mouth daily.      Marland Kitchen atorvastatin (LIPITOR) 20 MG tablet TAKE 1 TABLET BY MOUTH EVERY DAY AT 6 PM 30 tablet 5  . desloratadine (CLARINEX) 5 MG tablet TAKE 1 TABLET(5 MG) BY MOUTH DAILY 90 tablet 2  . esomeprazole (NEXIUM) 20 MG capsule Take 1 capsule (20 mg total) by mouth daily. 90 capsule 3  . mometasone (NASONEX) 50 MCG/ACT nasal spray SHAKE LIQUID AND USE 2 SPRAYS IN EACH NOSTRIL AT BEDTIME 17 g 3  . Multiple Vitamins-Minerals (MULTIVITAMIN WITH MINERALS) tablet Take 1 tablet by mouth daily.    Marland Kitchen PENNSAID 2 % SOLN Apply 1.5 application topically 2 (two) times daily.  2  . vitamin E 400 UNIT capsule Take 400 Units by mouth 2 (two) times daily.     Marland Kitchen XIIDRA 5 % SOLN Place 1 drop into both eyes 2 (two) times daily.  4   No current facility-administered medications on file prior to visit.     No Known Allergies

## 2018-01-29 ENCOUNTER — Ambulatory Visit (INDEPENDENT_AMBULATORY_CARE_PROVIDER_SITE_OTHER): Payer: BLUE CROSS/BLUE SHIELD | Admitting: Internal Medicine

## 2018-01-29 ENCOUNTER — Encounter: Payer: Self-pay | Admitting: Internal Medicine

## 2018-01-29 DIAGNOSIS — G4733 Obstructive sleep apnea (adult) (pediatric): Secondary | ICD-10-CM

## 2018-01-29 DIAGNOSIS — J0100 Acute maxillary sinusitis, unspecified: Secondary | ICD-10-CM | POA: Diagnosis not present

## 2018-01-29 NOTE — Progress Notes (Signed)
HPI M never smoker followed for OSA, complicated by obesity, HBP, allergic rhinitis, GERD NPSG 10/06/13- moderate OSA, AHI 24.7/ hr, loud snoring, weight 250 pounds  ------------------------------------------------------------------ 12/15/16- 62 year old male never smoker followed for OSA, complicated by obesity, HBP, allergic rhinitis CPAP auto 10-20 /Advanced or on-line-Transcend portable for travel ---Pt is doing well and sleeping well over all He uses his standard CPAP at home. When he travels he uses Transcend machine. For short trips he doesn't bother with humidifier, but it is more noisy that way so he does take the humidifier on longer trips. Aware of leak but it doesn't bother him. Download 97% compliance averaging 7 hours per night, AHI 1.2/hour  01/29/2018- 62 year old male never smoker followed for OSA, complicated by obesity, HBP, allergic rhinitis CPAP auto 10-20 /Advanced or on-line-Transcend portable for travel Body weight 259 pounds today -----he says that he that everything is going well with cpap mask is fitting well.  Download 57% compliance, reflecting travel machine use which does not record, AHI 1.5/hour.  He travels frequently that is about to leave for ski trip to [place].  He left his travel machine on one brief trip planned "I was miserable".  We discussed incidence of sinus infections associated with CPAP as being minimal. Currently has acute right maxillary sinusitis by description with pain, drainage and eustachian dysfunction on that side.  We had sent him prescription for Augmentin last night. I discussed Sudafed use for this. He does use Neti pot if needed.  ROS-see HPI + = positive Constitutional:   No-   weight loss, night sweats, fevers, chills, fatigue, lassitude. HEENT:   No-  headaches, difficulty swallowing, tooth/dental problems, sore throat,       No-  sneezing, itching, +ear ache, +nasal congestion, post nasal drip,  CV:  No-   chest pain, orthopnea,  PND, swelling in lower extremities, anasarca,                                                     dizziness, palpitations Resp: No-   shortness of breath with exertion or at rest.              No-   productive cough,  No non-productive cough,  No- coughing up of blood.              No-   change in color of mucus.  No- wheezing.   Skin: No-   rash or lesions. GI:  No-   heartburn, indigestion, abdominal pain, nausea, vomiting,  GU:  MS:  No-   joint pain or swelling.   Neuro-     nothing unusual Psych:  No- change in mood or affect. No depression or anxiety.  No memory loss.  OBJ- Physical Exam General- Alert, Oriented, Affect-appropriate, Distress- none acute, +overweight Skin- rash-none, lesions- none, excoriation- none Lymphadenopathy- none Head- atraumatic            Eyes- Gross vision intact, PERRLA, conjunctivae and secretions clear            Ears- Hearing, canals-normal            Nose- Clear, no-Septal dev, mucus, polyps, erosion, perforation             Throat- Mallampati III , mucosa clear , drainage- none, tonsils- atrophic Neck- flexible , trachea midline, no stridor ,  thyroid nl, carotid no bruit Chest - symmetrical excursion , unlabored           Heart/CV- RRR , no murmur , no gallop  , no rub, nl s1 s2                           - JVD- none , edema- none, stasis changes- none, varices- none           Lung- clear to P&A, wheeze- none, cough- none , dullness-none, rub- none           Chest wall-  Abd-  Br/ Gen/ Rectal- Not done, not indicated Extrem- cyanosis- none, clubbing, none, atrophy- none, strength- nl Neuro- grossly intact to observation

## 2018-01-29 NOTE — Assessment & Plan Note (Signed)
He definitely continues to benefit from CPAP with improved sleep and control of snoring. Plan-continue CPAP auto 10-20

## 2018-01-29 NOTE — Assessment & Plan Note (Signed)
Augmentin has been sent.  We discussed use of Sudafed, cleaning of CPAP.

## 2018-01-29 NOTE — Patient Instructions (Signed)
We can continue CPAP auto 10-20, mask of choice humidifier, supplies, Airview  A script for augmentin was sent to your drug store last night  Please call if we can help

## 2018-02-04 DIAGNOSIS — E785 Hyperlipidemia, unspecified: Secondary | ICD-10-CM | POA: Diagnosis not present

## 2018-02-04 DIAGNOSIS — G4733 Obstructive sleep apnea (adult) (pediatric): Secondary | ICD-10-CM | POA: Diagnosis not present

## 2018-02-08 MED ORDER — AMOXICILLIN-POT CLAVULANATE 875-125 MG PO TABS: 1.0000 | ORAL_TABLET | Freq: Two times a day (BID) | ORAL | 0 refills | Status: DC

## 2018-02-08 NOTE — Telephone Encounter (Signed)
Received mychart message from pt in regards to the augmentin abx.  Pt stated he left the prescription in [place] at the hotel while he was there and is requesting to have a new prescription of the augmentin to be sent to pharmacy.  Dr. Annamaria Boots, please advise if this is okay to send for pt. Thanks!

## 2018-02-08 NOTE — Addendum Note (Signed)
Addended by: Della Goo C on: 02/08/2018 04:36 PM   Modules accepted: Orders

## 2018-02-08 NOTE — Telephone Encounter (Signed)
Ok to refill augmentin

## 2018-02-17 ENCOUNTER — Telehealth: Payer: Self-pay | Admitting: Internal Medicine

## 2018-02-17 DIAGNOSIS — G4733 Obstructive sleep apnea (adult) (pediatric): Secondary | ICD-10-CM

## 2018-02-17 NOTE — Telephone Encounter (Signed)
LMTCB

## 2018-02-18 NOTE — Telephone Encounter (Signed)
Spoke with pt, he states he received a letter from his insurance stating they were going to stop payment due to CY notes. I called [facility] and spoke to [facility] and explained the situation and she said she would look into it and get back to me. When she can figure out what we need to do. Will await a return call. Please leave in triage.

## 2018-02-18 NOTE — Telephone Encounter (Addendum)
Spoke with Melissa, she states the patient is not under audit and don't see what he is talking about but he does have to call the respiratory team because they are trying to call him for a mask re-fit. I called pt to let him know but he stated the letter had nothing to do with that. So I called the insurance to see what was going on.

## 2018-02-18 NOTE — Telephone Encounter (Signed)
Attempted to call Patient.  Left message to call back. 

## 2018-02-18 NOTE — Telephone Encounter (Signed)
Patient is returning phone call.  Phone number is [phone].

## 2018-02-18 NOTE — Telephone Encounter (Signed)
I called [employer] to see why they sent the pt the letter and they stated it was due to non-compliance. The only reason why it is showing this is because he travels and has 3 cpap machines that he alternates. He must not have airview on all machines and those nights when he travels is not being recorded. I am not sure what to do about this. CY any suggestions?    01/29/2018- 63 year old male never smoker followed for OSA, complicated by obesity, HBP, allergic rhinitis CPAP auto 10-20 /Advanced or on-line-Transcend portable for travel Body weight 259 pounds today -----he says that he that everything is going well with cpap mask is fitting well.  Download 57% compliance, reflecting travel machine use which does not record, AHI 1.5/hour.  He travels frequently that is about to leave for ski trip to [place].  He left his travel machine on one brief trip planned "I was miserable".  We discussed incidence of sinus infections associated with CPAP as being minimal. Currently has acute right maxillary sinusitis by description with pain, drainage and eustachian dysfunction on that side.  We had sent him prescription for Augmentin last night. I discussed Sudafed use for this. He does use Neti pot if needed.

## 2018-02-19 NOTE — Telephone Encounter (Signed)
I don't see why there would be any problem with my last note. If Billy Todd at [employer] is working on it, she may take care of things. If there is still a problem, then we need to ask him to let us have a copy of the letter from his insurance so we can see how it was worded.

## 2018-02-19 NOTE — Telephone Encounter (Signed)
I guess I need to ask Billy Todd how we can record his sleep on all machines. I sent her a community message. I will await her response.

## 2018-02-22 NOTE — Telephone Encounter (Signed)
Billy Todd returned phone call °

## 2018-02-22 NOTE — Telephone Encounter (Signed)
Called and spoke with [place], [facility].  She stated that the Patient owns all his CPAP machines,so it is up to the Patient to to release downloads.  Called and spoke with Patient and explained that insurance needs recent downloads.   The Patient stated that he has 1 main machine that he uses and that is the one that downloads are received from.  He has a CPAP mini that is to old to receive downloads from and a travel resmed, that he keeps in his travel bag, that does not download,or have a SD card. Patient stated that [facility] sent the letter, because he needs CPAP supplies. Called and left message for Lenna Sciara, [facility] to call back once she was available.

## 2018-02-23 NOTE — Telephone Encounter (Signed)
Called and spoke with patient he stated that he is wanting to order his CPAP mask through [email or web address] patient is needing [place] to send in the prescription. CY please advise if this is ok to do. Thank you.

## 2018-02-23 NOTE — Telephone Encounter (Signed)
Yes, ok to print his script

## 2018-02-23 NOTE — Telephone Encounter (Signed)
Pt is aware that Rx has been printed and faxed as requested. Nothing more needed at this time.

## 2018-02-23 NOTE — Telephone Encounter (Signed)
Patient states needs order for CPAP supplies.  Order from [email or web address].  They need prescription order from [place].  Fax number is [phone].  Patients phone number is [phone].

## 2018-02-24 DIAGNOSIS — F329 Major depressive disorder, single episode, unspecified: Secondary | ICD-10-CM | POA: Diagnosis not present

## 2018-03-02 DIAGNOSIS — F329 Major depressive disorder, single episode, unspecified: Secondary | ICD-10-CM | POA: Diagnosis not present

## 2018-03-05 DIAGNOSIS — F329 Major depressive disorder, single episode, unspecified: Secondary | ICD-10-CM | POA: Diagnosis not present

## 2018-03-10 DIAGNOSIS — F329 Major depressive disorder, single episode, unspecified: Secondary | ICD-10-CM | POA: Diagnosis not present

## 2018-03-17 DIAGNOSIS — F329 Major depressive disorder, single episode, unspecified: Secondary | ICD-10-CM | POA: Diagnosis not present

## 2018-03-19 DIAGNOSIS — C61 Malignant neoplasm of prostate: Secondary | ICD-10-CM | POA: Diagnosis not present

## 2018-03-19 DIAGNOSIS — Z6831 Body mass index (BMI) 31.0-31.9, adult: Secondary | ICD-10-CM | POA: Diagnosis not present

## 2018-03-19 DIAGNOSIS — R300 Dysuria: Secondary | ICD-10-CM | POA: Diagnosis not present

## 2018-03-22 ENCOUNTER — Encounter: Payer: Self-pay | Admitting: Family Medicine

## 2018-03-24 DIAGNOSIS — F329 Major depressive disorder, single episode, unspecified: Secondary | ICD-10-CM | POA: Diagnosis not present

## 2018-03-26 ENCOUNTER — Ambulatory Visit (INDEPENDENT_AMBULATORY_CARE_PROVIDER_SITE_OTHER): Payer: BLUE CROSS/BLUE SHIELD | Admitting: Family Medicine

## 2018-03-26 ENCOUNTER — Encounter: Payer: Self-pay | Admitting: Family Medicine

## 2018-03-26 VITALS — BP 122/82 | HR 72 | Temp 98.2°F | Ht 72.0 in | Wt 229.0 lb

## 2018-03-26 DIAGNOSIS — Z6831 Body mass index (BMI) 31.0-31.9, adult: Secondary | ICD-10-CM

## 2018-03-26 DIAGNOSIS — C61 Malignant neoplasm of prostate: Secondary | ICD-10-CM

## 2018-03-26 DIAGNOSIS — I10 Essential (primary) hypertension: Secondary | ICD-10-CM

## 2018-03-26 DIAGNOSIS — G4700 Insomnia, unspecified: Secondary | ICD-10-CM

## 2018-03-26 DIAGNOSIS — F419 Anxiety disorder, unspecified: Secondary | ICD-10-CM

## 2018-03-26 MED ORDER — TRAZODONE HCL 50 MG PO TABS: 25.0000 mg | ORAL_TABLET | Freq: Every evening | ORAL | 1 refills | Status: DC | PRN

## 2018-03-26 NOTE — Patient Instructions (Addendum)
Lets try trazodone before bed 1.  Start with 1/2 tablet tonight 2.  If not effective-increase to full tablet. 3.  If after 5 days not able to rest better-stop the trazodone 4.  At that point we will trial melatonin somewhere in the 2 to 3 mg range-may have to cut a 5 mg pill in half.  Trial that for at least a week to try to reset your sleep clock-do about 15 to 30 minutes before bed  Thrilled you are set up with counseling-please continue this  How about we reevaluate in 2 to 4 weeks-bring your cuff with you to the visit.  We can also reassess your Sleep at that time-may need to advance to something like Ambien extended release at least for a short period.

## 2018-03-26 NOTE — Progress Notes (Signed)
Phone [phone]   Subjective:  Billy Todd is a 63 y.o. year old very pleasant male patient who presents for/with See problem oriented charting ROS-admits to stress, anxiety, poor sleep.  No suicidal ideation.  BMI monitoring- elevated BMI noted: Body mass index is 31.06 kg/m. Encouraged need for healthy eating, regular exercise, weight loss.     BMI Metric Follow Up - 03/27/18 1512      BMI Metric Follow Up-Please document annually   BMI Metric Follow Up  Education provided       Past Medical History-  Patient Active Problem List   Diagnosis Date Noted  . Hyperglycemia 05/12/2014    Priority: Medium  . Obstructive sleep apnea 08/05/2013    Priority: Medium  . Obesity 03/02/2009    Priority: Medium  . Hyperlipidemia 02/23/2007    Priority: Medium  . Essential hypertension 02/23/2007    Priority: Medium  . Bursitis of left shoulder 07/21/2014    Priority: Low  . Anterior thigh numbness 07/21/2014    Priority: Low  . Tear of MCL (medial collateral ligament) of knee 06/09/2014    Priority: Low  . Personal history of adenomatous colonic polyps 01/07/2012    Priority: Low  . Allergic rhinitis 02/23/2007    Priority: Low  . GERD 02/23/2007    Priority: Low  . Sinusitis, acute maxillary 01/29/2018  . Prostate cancer ([place]) 12/19/2016  . Acute medial meniscal tear 06/09/2014  . Displacement of lumbar intervertebral disc without myelopathy 04/19/2013    Medications- reviewed and updated Current Outpatient Medications  Medication Sig Dispense Refill  . aspirin 81 MG tablet Take 81 mg by mouth daily.      Marland Kitchen atorvastatin (LIPITOR) 20 MG tablet TAKE 1 TABLET BY MOUTH EVERY DAY AT 6 PM 30 tablet 5  . desloratadine (CLARINEX) 5 MG tablet TAKE 1 TABLET(5 MG) BY MOUTH DAILY 90 tablet 2  . esomeprazole (NEXIUM) 20 MG capsule Take 1 capsule (20 mg total) by mouth daily. 90 capsule 3  . Multiple Vitamins-Minerals (MULTIVITAMIN WITH MINERALS) tablet Take 1 tablet by mouth  daily.    Marland Kitchen PENNSAID 2 % SOLN Apply 1.5 application topically 2 (two) times daily.  2  . vitamin E 400 UNIT capsule Take 400 Units by mouth 2 (two) times daily.     Marland Kitchen XIIDRA 5 % SOLN Place 1 drop into both eyes 2 (two) times daily.  4  . fluticasone (FLONASE) 50 MCG/ACT nasal spray Place 2 sprays into both nostrils daily. 16 g 5  . traZODone (DESYREL) 50 MG tablet Take 0.5-1 tablets (25-50 mg total) by mouth at bedtime as needed for sleep. 30 tablet 1   No current facility-administered medications for this visit.      Objective:  BP 122/82 (BP Location: Left Arm, Patient Position: Sitting, Cuff Size: Large)   Pulse 72   Temp 98.2 F (36.8 C) (Oral)   Ht 6' (1.829 m)   Wt 229 lb (103.9 kg)   SpO2 96%   BMI 31.06 kg/m  Gen: NAD, resting comfortably  CV: RRR  Lungs: nonlabored, normal respiratory rate Abdomen: soft/nondistended     Assessment and Plan   #Hypertension S: Was able to come off all medications in 2019 with weight loss.  He has noted some elevated numbers at his specialists offices and with home readings- 150/85 this morning after relaxing.   Still eating well (plant based) and exercising regularly.  Has lost some more weight A/P: Well controlled in office today.  Anxiety  may be strongly contributing to elevations at home and in specialist office.  We opted to continue to monitor as long as well controlled in office here  #Insomnia/anxiety  S: Patient reports a fair amount of stress 2 months. Coronavirus has increased stress [place]   Started with difficulty sleeping about a month ago.  No issues going to sleep but has difficulty with maintaining sleep. broke up with fiancee early this year. Might of slept one full night since 5th of January- which is when broke up happened. Sleeps perhaps 2 hours. Wakes up worrying about his job and fiancee- some turmoil with his contract.   Also when he feels really stressed he gets a  sensation in his head that it is "hot and swollen "  Seeing a counselor from [place]- able to do through skype.  He researched him thoroughly before signing up-has uncovered Grief still after wife loss in 2016. 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 A/P: Insomnia-sleep maintenance due to anxiety- related to recent stressors as well as unresolved grief.  May need to consider SSRI/antidepressant From [facility] "  Patient Instructions  Lets try trazodone before bed 1.  Start with 1/2 tablet tonight 2.  If not effective-increase to full tablet. 3.  If after 5 days not able to rest better-stop the trazodone 4.  At that point we will trial melatonin somewhere in the 2 to 3 mg range-may have to cut a 5 mg pill in half.  Trial that for at least a week to try to reset your sleep clock-do about 15 to 30 minutes before bed  Thrilled you are set up with counseling-please continue this  How about we reevaluate in 2 to 4 weeks-bring your cuff with you to the visit.  We can also reassess your Sleep at that time-may need to advance to something like Ambien extended release at least for a short period.    "  Future Appointments  Date Time Provider [place]  04/19/2018  4:00 PM Marin Olp, MD [facility]  06/22/2018  2:20 PM Marin Olp, MD [facility]  02/01/2019  9:00 AM Deneise Lever, MD [facility] None     Meds ordered this encounter  Medications  . traZODone (DESYREL) 50 MG tablet    Sig: Take 0.5-1 tablets (25-50 mg total) by mouth at bedtime as needed for sleep.    Dispense:  30 tablet    Refill:  1  . fluticasone (FLONASE) 50 MCG/ACT nasal spray    Sig: Place 2 sprays into both nostrils daily.    Dispense:  16 g    Refill:  5    Time Stamp The duration of face-to-face time during this visit was greater than 25 minutes. Greater than 50% of this time was spent in counseling, explanation of  diagnosis, planning of further management, and/or coordination of care including discussing recent stressors discussing treatment strategies discussing potential side effects.     Return precautions advised.  Garret Reddish, MD

## 2018-03-27 ENCOUNTER — Encounter: Payer: Self-pay | Admitting: Family Medicine

## 2018-03-27 MED ORDER — FLUTICASONE PROPIONATE 50 MCG/ACT NA SUSP: 2.0000 | Freq: Every day | NASAL | 5 refills | Status: DC

## 2018-03-27 NOTE — Assessment & Plan Note (Signed)
Remains under active surveillance with [facility].  Has seen them recently and PSA stable.

## 2018-03-28 ENCOUNTER — Encounter: Payer: Self-pay | Admitting: Family Medicine

## 2018-03-29 ENCOUNTER — Encounter: Payer: Self-pay | Admitting: Family Medicine

## 2018-03-29 MED ORDER — OMEPRAZOLE 20 MG PO CPDR: 20.0000 mg | DELAYED_RELEASE_CAPSULE | Freq: Every day | ORAL | 3 refills | Status: DC

## 2018-03-30 DIAGNOSIS — F329 Major depressive disorder, single episode, unspecified: Secondary | ICD-10-CM | POA: Diagnosis not present

## 2018-04-01 ENCOUNTER — Telehealth: Payer: Self-pay

## 2018-04-01 NOTE — Telephone Encounter (Signed)
Jahson Mccauslin Key: [id number] - Rx #: [id number] Need help? Call us at [phone]  Status  Sent to [employer]  DrugEsomeprazole Magnesium 20MG  dr capsules  Form[employer] Cross [place] Commercial Electronic Request Form (CB)  Original Claim Info70    Waiting on response

## 2018-04-02 NOTE — Telephone Encounter (Signed)
Is there a sub. You would like the pt to switch to? If not I can inform the pt of Good Rx Please advise

## 2018-04-02 NOTE — Telephone Encounter (Signed)
See mychart message 03/29/18

## 2018-04-02 NOTE — Telephone Encounter (Signed)
Jagger Schaad Key: [id number] - Rx #: [id number] Need help? Call us at [phone]  Outcome  Deniedtoday  DrugEsomeprazole Magnesium 20MG  dr capsules  FormBlue Cross [place] Commercial Electronic Request Form (CB)  Original Claim Info70

## 2018-04-05 NOTE — Telephone Encounter (Signed)
Alternative medication has been sent in per MyChart message on 03/29/2018.

## 2018-04-07 DIAGNOSIS — F329 Major depressive disorder, single episode, unspecified: Secondary | ICD-10-CM | POA: Diagnosis not present

## 2018-04-15 DIAGNOSIS — F329 Major depressive disorder, single episode, unspecified: Secondary | ICD-10-CM | POA: Diagnosis not present

## 2018-04-19 ENCOUNTER — Encounter: Payer: Self-pay | Admitting: Family Medicine

## 2018-04-19 ENCOUNTER — Ambulatory Visit (INDEPENDENT_AMBULATORY_CARE_PROVIDER_SITE_OTHER): Payer: BLUE CROSS/BLUE SHIELD | Admitting: Family Medicine

## 2018-04-19 VITALS — BP 118/74 | HR 69 | Temp 98.4°F | Ht 72.0 in | Wt 232.0 lb

## 2018-04-19 DIAGNOSIS — F419 Anxiety disorder, unspecified: Secondary | ICD-10-CM

## 2018-04-19 DIAGNOSIS — G4700 Insomnia, unspecified: Secondary | ICD-10-CM | POA: Diagnosis not present

## 2018-04-19 NOTE — Progress Notes (Signed)
Phone [phone]   Subjective:  Billy Todd is a 63 y.o. year old very pleasant male patient who presents for/with See problem oriented charting ROS-no chest pain or shortness of breath reported.  Anxiety and insomnia are improving.  No edema  Past Medical History-  Patient Active Problem List   Diagnosis Date Noted  . Hyperglycemia 05/12/2014    Priority: Medium  . Obstructive sleep apnea 08/05/2013    Priority: Medium  . Obesity 03/02/2009    Priority: Medium  . Hyperlipidemia 02/23/2007    Priority: Medium  . Essential hypertension 02/23/2007    Priority: Medium  . Bursitis of left shoulder 07/21/2014    Priority: Low  . Anterior thigh numbness 07/21/2014    Priority: Low  . Tear of MCL (medial collateral ligament) of knee 06/09/2014    Priority: Low  . Personal history of adenomatous colonic polyps 01/07/2012    Priority: Low  . Allergic rhinitis 02/23/2007    Priority: Low  . GERD 02/23/2007    Priority: Low  . Sinusitis, acute maxillary 01/29/2018  . Prostate cancer ([place]) 12/19/2016  . Acute medial meniscal tear 06/09/2014  . Displacement of lumbar intervertebral disc without myelopathy 04/19/2013    Medications- reviewed and updated Current Outpatient Medications  Medication Sig Dispense Refill  . aspirin 81 MG tablet Take 81 mg by mouth daily.      Marland Kitchen atorvastatin (LIPITOR) 20 MG tablet TAKE 1 TABLET BY MOUTH EVERY DAY AT 6 PM 30 tablet 5  . desloratadine (CLARINEX) 5 MG tablet TAKE 1 TABLET(5 MG) BY MOUTH DAILY 90 tablet 2  . fluticasone (FLONASE) 50 MCG/ACT nasal spray Place 2 sprays into both nostrils daily. 16 g 5  . Multiple Vitamins-Minerals (MULTIVITAMIN WITH MINERALS) tablet Take 1 tablet by mouth daily.    Marland Kitchen omeprazole (PRILOSEC) 20 MG capsule Take 1 capsule (20 mg total) by mouth daily. 90 capsule 3  . PENNSAID 2 % SOLN Apply 1.5 application topically 2 (two) times daily.  2  . vitamin E 400 UNIT capsule Take 400 Units by mouth 2 (two) times  daily.     Marland Kitchen XIIDRA 5 % SOLN Place 1 drop into both eyes 2 (two) times daily.  4   No current facility-administered medications for this visit.      Objective:  BP 118/74   Pulse 69   Temp 98.4 F (36.9 C) (Oral)   Ht 6' (1.829 m)   Wt 232 lb (105.2 kg)   SpO2 96%   BMI 31.46 kg/m  Gen: NAD, resting comfortably  CV: RRR  Lungs: nonlabored, normal respiratory rate Abdomen: soft/nondistended     Assessment and Plan   Insomnia and anxiety S: At last visit, patient feeling with large amount of stress- see last visit notes.  This was translating into poor sleep.  Issues with sleep maintenance due to anxiety.  We started patient with low-dose trazodone.  We discussed if that was not effective trial of melatonin into 3 mg range.  Sleep- didn't do well with trazodone x 3- bad taste in mouth, felt draggy next day.   Started out a probiotic that also has melatonin- had a really good night's sleep last night.   Ex- Fiance- doesn't look like it is going to work out getting back with her.  He has been doing counseling with someone medical nationally known-does this through skype. Has continued weekly counseling right now.  A/P: Anxiety and insomnia much improved with counseling and time.  He seems to  be doing better with more time after his break-up with fianc. He is interested in potential EMDR therapy-I gave him the name of Billy Todd.  Extended counseling provided today.   Insomnia did not do well on trazodone- he will continue melatonin alone for now.  He lifted Consumer Reports information about various sleep medications and realized he may only get an extra 30 minutes of sleep study did not want to continue to use these  Patient is planning to sell his home and considering moving closer to family.  He would them look for employment in that area.  The idea of this really seems to bring him some peace.  Future Appointments  Date Time Provider [place]  06/22/2018  2:20  PM Marin Olp, MD [facility]  02/01/2019  9:00 AM Deneise Lever, MD [facility] None    Time Stamp The duration of face-to-face time during this visit was greater than 25 minutes (4:16-4:55 PM). 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  I was encouraged that patient is considering putting proximity to friends and family over even his work   Return precautions advised.  Garret Reddish, MD

## 2018-04-19 NOTE — Patient Instructions (Addendum)
[email or web address] is another option. He does take [employer] I believe. Text him if can't reach by phone.   Glad you are doing so much better  Glad blood pressure looks great as well

## 2018-04-22 DIAGNOSIS — F329 Major depressive disorder, single episode, unspecified: Secondary | ICD-10-CM | POA: Diagnosis not present

## 2018-05-06 DIAGNOSIS — F329 Major depressive disorder, single episode, unspecified: Secondary | ICD-10-CM | POA: Diagnosis not present

## 2018-05-11 DIAGNOSIS — G4733 Obstructive sleep apnea (adult) (pediatric): Secondary | ICD-10-CM | POA: Diagnosis not present

## 2018-05-13 DIAGNOSIS — F329 Major depressive disorder, single episode, unspecified: Secondary | ICD-10-CM | POA: Diagnosis not present

## 2018-05-19 DIAGNOSIS — F329 Major depressive disorder, single episode, unspecified: Secondary | ICD-10-CM | POA: Diagnosis not present

## 2018-05-28 DIAGNOSIS — F329 Major depressive disorder, single episode, unspecified: Secondary | ICD-10-CM | POA: Diagnosis not present

## 2018-06-02 DIAGNOSIS — F329 Major depressive disorder, single episode, unspecified: Secondary | ICD-10-CM | POA: Diagnosis not present

## 2018-06-09 DIAGNOSIS — F329 Major depressive disorder, single episode, unspecified: Secondary | ICD-10-CM | POA: Diagnosis not present

## 2018-06-22 ENCOUNTER — Encounter: Payer: BLUE CROSS/BLUE SHIELD | Admitting: Family Medicine

## 2018-06-23 DIAGNOSIS — F329 Major depressive disorder, single episode, unspecified: Secondary | ICD-10-CM | POA: Diagnosis not present

## 2018-07-07 DIAGNOSIS — F329 Major depressive disorder, single episode, unspecified: Secondary | ICD-10-CM | POA: Diagnosis not present

## 2018-07-14 DIAGNOSIS — F329 Major depressive disorder, single episode, unspecified: Secondary | ICD-10-CM | POA: Diagnosis not present

## 2018-07-20 ENCOUNTER — Other Ambulatory Visit: Payer: Self-pay | Admitting: Family Medicine

## 2018-07-21 DIAGNOSIS — F329 Major depressive disorder, single episode, unspecified: Secondary | ICD-10-CM | POA: Diagnosis not present

## 2018-07-22 DIAGNOSIS — F329 Major depressive disorder, single episode, unspecified: Secondary | ICD-10-CM | POA: Diagnosis not present

## 2018-07-28 DIAGNOSIS — F329 Major depressive disorder, single episode, unspecified: Secondary | ICD-10-CM | POA: Diagnosis not present

## 2018-07-29 DIAGNOSIS — F329 Major depressive disorder, single episode, unspecified: Secondary | ICD-10-CM | POA: Diagnosis not present

## 2018-07-30 DIAGNOSIS — F329 Major depressive disorder, single episode, unspecified: Secondary | ICD-10-CM | POA: Diagnosis not present

## 2018-08-04 DIAGNOSIS — F329 Major depressive disorder, single episode, unspecified: Secondary | ICD-10-CM | POA: Diagnosis not present

## 2018-08-05 ENCOUNTER — Telehealth: Payer: Self-pay

## 2018-08-05 NOTE — Telephone Encounter (Signed)
Called and spoke with [employer] to inform them the orders should be faxed to the patients pulmonologist. The office verbalized understanding.

## 2018-08-05 NOTE — Telephone Encounter (Signed)
Copied from [place] [phone]. Topic: General - Other >> Aug 05, 2018  9:36 AM Leward Quan A wrote: Reason for CRM: Hernan with [employer] called to say that there was an order faxed over fo patient to get his Cpap supplies. Asking for this back as soon as possible please. Any questions please call Ph# [phone]

## 2018-08-06 DIAGNOSIS — F329 Major depressive disorder, single episode, unspecified: Secondary | ICD-10-CM | POA: Diagnosis not present

## 2018-08-16 ENCOUNTER — Other Ambulatory Visit: Payer: Self-pay | Admitting: Family Medicine

## 2018-08-18 DIAGNOSIS — F329 Major depressive disorder, single episode, unspecified: Secondary | ICD-10-CM | POA: Diagnosis not present

## 2018-08-20 DIAGNOSIS — F329 Major depressive disorder, single episode, unspecified: Secondary | ICD-10-CM | POA: Diagnosis not present

## 2018-08-25 DIAGNOSIS — F329 Major depressive disorder, single episode, unspecified: Secondary | ICD-10-CM | POA: Diagnosis not present

## 2018-08-27 DIAGNOSIS — F329 Major depressive disorder, single episode, unspecified: Secondary | ICD-10-CM | POA: Diagnosis not present

## 2018-09-01 DIAGNOSIS — F329 Major depressive disorder, single episode, unspecified: Secondary | ICD-10-CM | POA: Diagnosis not present

## 2018-09-03 DIAGNOSIS — F329 Major depressive disorder, single episode, unspecified: Secondary | ICD-10-CM | POA: Diagnosis not present

## 2018-09-08 DIAGNOSIS — F329 Major depressive disorder, single episode, unspecified: Secondary | ICD-10-CM | POA: Diagnosis not present

## 2018-09-10 ENCOUNTER — Other Ambulatory Visit: Payer: Self-pay | Admitting: Family Medicine

## 2018-09-10 DIAGNOSIS — F329 Major depressive disorder, single episode, unspecified: Secondary | ICD-10-CM | POA: Diagnosis not present

## 2018-09-15 DIAGNOSIS — F329 Major depressive disorder, single episode, unspecified: Secondary | ICD-10-CM | POA: Diagnosis not present

## 2018-09-19 ENCOUNTER — Other Ambulatory Visit: Payer: Self-pay | Admitting: Family Medicine

## 2018-09-22 DIAGNOSIS — F329 Major depressive disorder, single episode, unspecified: Secondary | ICD-10-CM | POA: Diagnosis not present

## 2018-09-29 DIAGNOSIS — F329 Major depressive disorder, single episode, unspecified: Secondary | ICD-10-CM | POA: Diagnosis not present

## 2018-10-08 DIAGNOSIS — F329 Major depressive disorder, single episode, unspecified: Secondary | ICD-10-CM | POA: Diagnosis not present

## 2018-10-13 DIAGNOSIS — F329 Major depressive disorder, single episode, unspecified: Secondary | ICD-10-CM | POA: Diagnosis not present

## 2018-10-20 DIAGNOSIS — F329 Major depressive disorder, single episode, unspecified: Secondary | ICD-10-CM | POA: Diagnosis not present

## 2018-10-22 DIAGNOSIS — F329 Major depressive disorder, single episode, unspecified: Secondary | ICD-10-CM | POA: Diagnosis not present

## 2018-10-27 DIAGNOSIS — F329 Major depressive disorder, single episode, unspecified: Secondary | ICD-10-CM | POA: Diagnosis not present

## 2018-11-03 DIAGNOSIS — F329 Major depressive disorder, single episode, unspecified: Secondary | ICD-10-CM | POA: Diagnosis not present

## 2018-11-08 NOTE — Progress Notes (Signed)
Appears to be same day cancellation

## 2018-11-08 NOTE — Patient Instructions (Signed)
Health Maintenance Due  Topic Date Due  . INFLUENZA VACCINE  09/11/2018   Depression screen [facility] 2/9 03/26/2018 06/18/2017 12/19/2016  Decreased Interest 2 0 0  Down, Depressed, Hopeless 2 0 0  PHQ - 2 Score 4 0 0  Altered sleeping 3 - -  Tired, decreased energy 1 - -  Change in appetite 1 - -  Feeling bad or failure about yourself  3 - -  Trouble concentrating 2 - -  Moving slowly or fidgety/restless 0 - -  Suicidal thoughts 0 - -  PHQ-9 Score 14 - -  Difficult doing work/chores Very difficult - -

## 2018-11-09 ENCOUNTER — Encounter: Payer: Self-pay | Admitting: Family Medicine

## 2018-11-10 DIAGNOSIS — F329 Major depressive disorder, single episode, unspecified: Secondary | ICD-10-CM | POA: Diagnosis not present

## 2018-11-11 ENCOUNTER — Ambulatory Visit: Payer: BLUE CROSS/BLUE SHIELD | Admitting: Family Medicine

## 2018-11-17 DIAGNOSIS — F329 Major depressive disorder, single episode, unspecified: Secondary | ICD-10-CM | POA: Diagnosis not present

## 2018-11-19 DIAGNOSIS — H259 Unspecified age-related cataract: Secondary | ICD-10-CM | POA: Diagnosis not present

## 2018-11-19 DIAGNOSIS — I10 Essential (primary) hypertension: Secondary | ICD-10-CM | POA: Diagnosis not present

## 2018-11-19 DIAGNOSIS — Z1283 Encounter for screening for malignant neoplasm of skin: Secondary | ICD-10-CM | POA: Diagnosis not present

## 2018-11-19 DIAGNOSIS — E785 Hyperlipidemia, unspecified: Secondary | ICD-10-CM | POA: Diagnosis not present

## 2018-11-19 DIAGNOSIS — C61 Malignant neoplasm of prostate: Secondary | ICD-10-CM | POA: Diagnosis not present

## 2018-11-19 DIAGNOSIS — G4730 Sleep apnea, unspecified: Secondary | ICD-10-CM | POA: Diagnosis not present

## 2018-11-26 DIAGNOSIS — G4733 Obstructive sleep apnea (adult) (pediatric): Secondary | ICD-10-CM | POA: Diagnosis not present

## 2018-12-02 DIAGNOSIS — H0015 Chalazion left lower eyelid: Secondary | ICD-10-CM | POA: Diagnosis not present

## 2018-12-02 DIAGNOSIS — L821 Other seborrheic keratosis: Secondary | ICD-10-CM | POA: Diagnosis not present

## 2018-12-02 DIAGNOSIS — L570 Actinic keratosis: Secondary | ICD-10-CM | POA: Diagnosis not present

## 2018-12-02 DIAGNOSIS — D1801 Hemangioma of skin and subcutaneous tissue: Secondary | ICD-10-CM | POA: Diagnosis not present

## 2018-12-09 DIAGNOSIS — H25093 Other age-related incipient cataract, bilateral: Secondary | ICD-10-CM | POA: Diagnosis not present

## 2018-12-09 DIAGNOSIS — H00015 Hordeolum externum left lower eyelid: Secondary | ICD-10-CM | POA: Diagnosis not present

## 2018-12-10 DIAGNOSIS — R399 Unspecified symptoms and signs involving the genitourinary system: Secondary | ICD-10-CM | POA: Diagnosis not present

## 2018-12-10 DIAGNOSIS — C61 Malignant neoplasm of prostate: Secondary | ICD-10-CM | POA: Diagnosis not present

## 2018-12-15 DIAGNOSIS — G4730 Sleep apnea, unspecified: Secondary | ICD-10-CM | POA: Diagnosis not present

## 2018-12-27 NOTE — Telephone Encounter (Signed)
Tyler email to Dr. Annamaria Boots as Juluis Rainier

## 2019-01-14 ENCOUNTER — Other Ambulatory Visit: Payer: Self-pay | Admitting: Family Medicine

## 2019-01-14 NOTE — Telephone Encounter (Signed)
Patient does not have you listed as PCP. Ok to refill?

## 2019-02-01 ENCOUNTER — Ambulatory Visit: Payer: BLUE CROSS/BLUE SHIELD | Admitting: Internal Medicine

## 2019-04-06 ENCOUNTER — Other Ambulatory Visit: Payer: Self-pay | Admitting: Family Medicine

## 2019-12-20 ENCOUNTER — Other Ambulatory Visit: Payer: Self-pay | Admitting: Family Medicine
